# Patient Record
Sex: Male | Born: 1960 | Race: White | Hispanic: No | Marital: Single | Smoking: Never smoker
Health system: Southern US, Community
[De-identification: ages and names within clinical notes are randomized; demographics above are authoritative.]

## PROBLEM LIST (undated history)

## (undated) DIAGNOSIS — Z21 Asymptomatic human immunodeficiency virus [HIV] infection status: Secondary | ICD-10-CM

## (undated) DIAGNOSIS — E785 Hyperlipidemia, unspecified: Secondary | ICD-10-CM

## (undated) DIAGNOSIS — R55 Syncope and collapse: Secondary | ICD-10-CM

## (undated) DIAGNOSIS — I10 Essential (primary) hypertension: Secondary | ICD-10-CM

## (undated) DIAGNOSIS — B20 Human immunodeficiency virus [HIV] disease: Secondary | ICD-10-CM

## (undated) DIAGNOSIS — E7800 Pure hypercholesterolemia, unspecified: Secondary | ICD-10-CM

## (undated) DIAGNOSIS — G4733 Obstructive sleep apnea (adult) (pediatric): Secondary | ICD-10-CM

## (undated) DIAGNOSIS — R42 Dizziness and giddiness: Secondary | ICD-10-CM

## (undated) DIAGNOSIS — R002 Palpitations: Secondary | ICD-10-CM

## (undated) DIAGNOSIS — F419 Anxiety disorder, unspecified: Secondary | ICD-10-CM

## (undated) HISTORY — DX: Essential (primary) hypertension: I10

## (undated) HISTORY — DX: Syncope and collapse: R55

## (undated) HISTORY — DX: Dizziness and giddiness: R42

## (undated) HISTORY — DX: Human immunodeficiency virus (HIV) disease: B20

## (undated) HISTORY — DX: Palpitations: R00.2

## (undated) HISTORY — DX: Pure hypercholesterolemia, unspecified: E78.00

## (undated) HISTORY — DX: Anxiety disorder, unspecified: F41.9

## (undated) HISTORY — DX: Hyperlipidemia, unspecified: E78.5

## (undated) HISTORY — DX: Obstructive sleep apnea (adult) (pediatric): G47.33

## (undated) HISTORY — DX: Asymptomatic human immunodeficiency virus (hiv) infection status: Z21

---

## 1999-02-04 ENCOUNTER — Emergency Department (HOSPITAL_COMMUNITY): Admission: EM | Admit: 1999-02-04 | Discharge: 1999-02-04 | Payer: Self-pay | Admitting: Emergency Medicine

## 1999-02-07 ENCOUNTER — Encounter (HOSPITAL_COMMUNITY): Admission: RE | Admit: 1999-02-07 | Discharge: 1999-02-11 | Payer: Self-pay | Admitting: Emergency Medicine

## 1999-02-18 ENCOUNTER — Encounter: Admission: RE | Admit: 1999-02-18 | Discharge: 1999-05-19 | Payer: Self-pay | Admitting: Emergency Medicine

## 2000-11-27 ENCOUNTER — Emergency Department (HOSPITAL_COMMUNITY): Admission: EM | Admit: 2000-11-27 | Discharge: 2000-11-27 | Payer: Self-pay | Admitting: *Deleted

## 2000-11-27 ENCOUNTER — Encounter: Payer: Self-pay | Admitting: Emergency Medicine

## 2002-06-21 HISTORY — PX: TONSILLECTOMY: SUR1361

## 2002-06-21 HISTORY — PX: UVULOPALATOPHARYNGOPLASTY: SHX827

## 2007-10-24 ENCOUNTER — Emergency Department (HOSPITAL_COMMUNITY): Admission: EM | Admit: 2007-10-24 | Discharge: 2007-10-24 | Payer: Self-pay | Admitting: Emergency Medicine

## 2008-06-27 ENCOUNTER — Emergency Department (HOSPITAL_COMMUNITY): Admission: EM | Admit: 2008-06-27 | Discharge: 2008-06-27 | Payer: Self-pay | Admitting: Emergency Medicine

## 2008-07-18 ENCOUNTER — Encounter: Payer: Self-pay | Admitting: *Deleted

## 2009-05-06 IMAGING — CR DG CHEST 2V
2 series · 2 of 2 positions shown · non-contrast
Comparison: None

CLINICAL DATA: Chest pain

CHEST - 2 VIEW

[w chest pa]
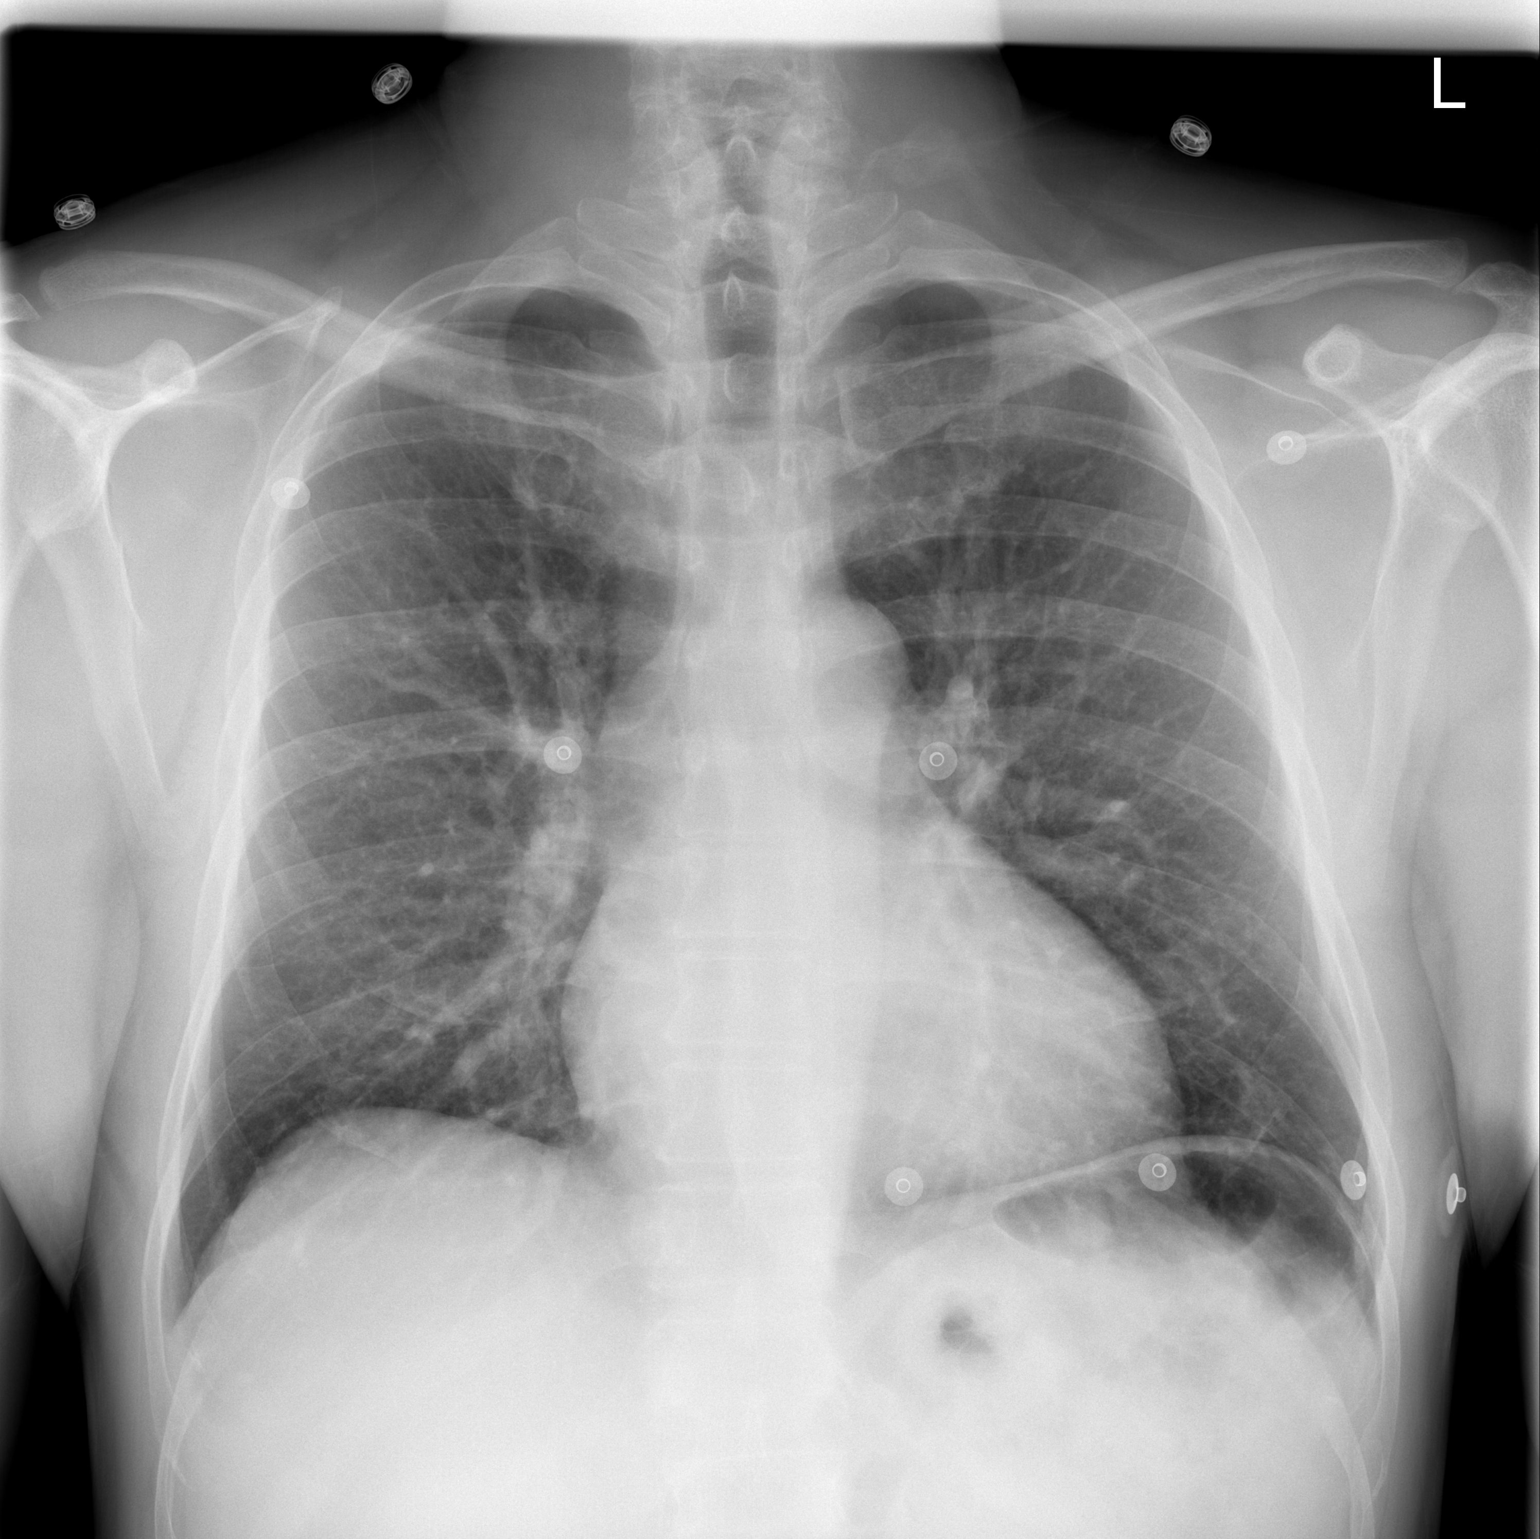

[w chest lat]
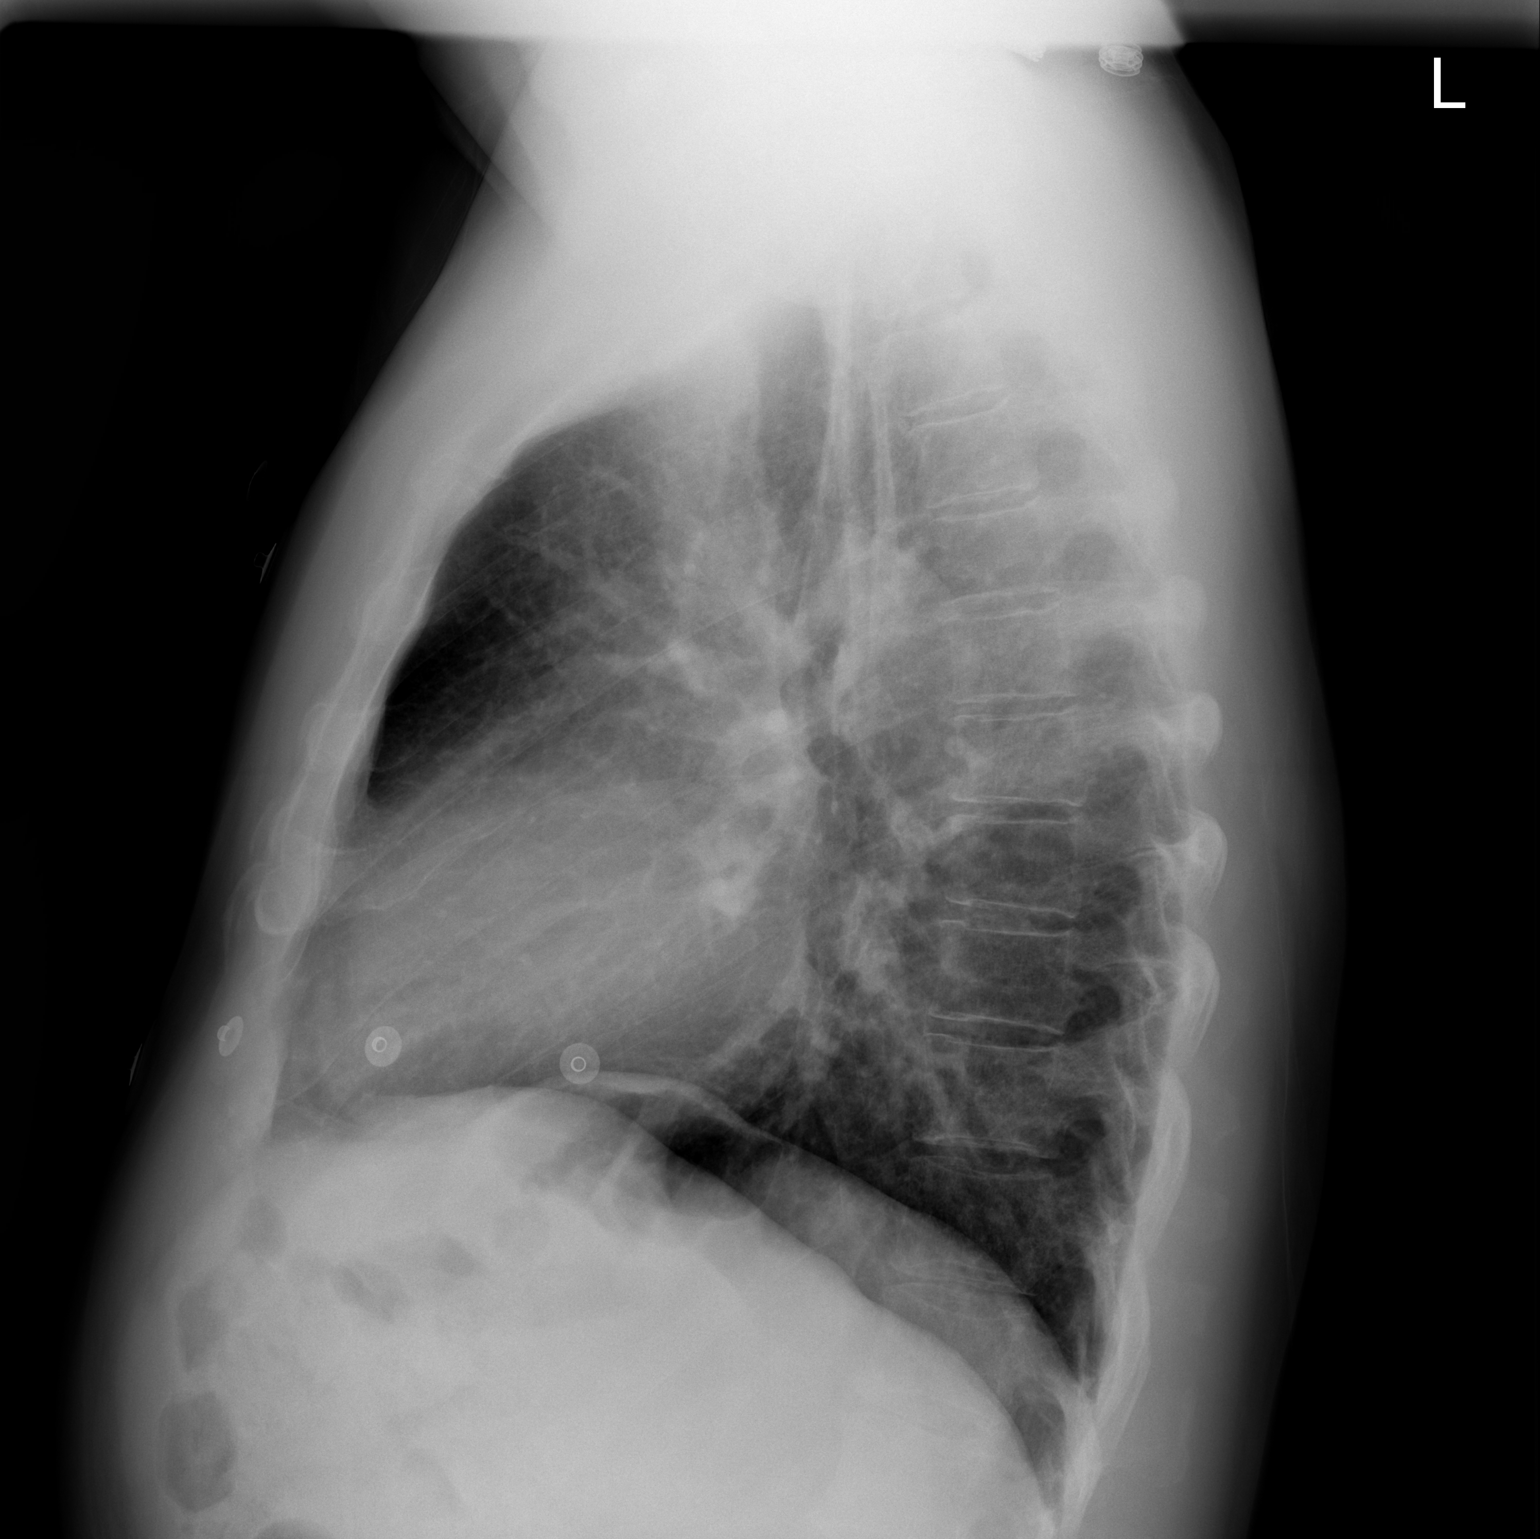

[2 of 2 positions shown; findings below may reference images not displayed]

FINDINGS: Normal mediastinum and cardiac silhouette.  Costophrenic
angles are clear.  No evidence effusion, infiltrate, or
pneumothorax.
IMPRESSION: No acute cardiopulmonary process.

## 2009-12-04 ENCOUNTER — Emergency Department (HOSPITAL_COMMUNITY): Admission: EM | Admit: 2009-12-04 | Discharge: 2009-12-04 | Payer: Self-pay | Admitting: Emergency Medicine

## 2010-10-05 LAB — URINALYSIS, ROUTINE W REFLEX MICROSCOPIC
Bilirubin Urine: NEGATIVE
Glucose, UA: NEGATIVE mg/dL
Hgb urine dipstick: NEGATIVE
Ketones, ur: NEGATIVE mg/dL
Nitrite: NEGATIVE
Protein, ur: NEGATIVE mg/dL
Specific Gravity, Urine: 1.016 (ref 1.005–1.030)
Urobilinogen, UA: 0.2 mg/dL (ref 0.0–1.0)
pH: 7 (ref 5.0–8.0)

## 2010-10-05 LAB — POCT I-STAT, CHEM 8
BUN: 17 mg/dL (ref 6–23)
Calcium, Ion: 1.04 mmol/L — ABNORMAL LOW (ref 1.12–1.32)
Chloride: 102 mEq/L (ref 96–112)
Creatinine, Ser: 1.1 mg/dL (ref 0.4–1.5)
Glucose, Bld: 106 mg/dL — ABNORMAL HIGH (ref 70–99)
HCT: 46 % (ref 39.0–52.0)
Hemoglobin: 15.6 g/dL (ref 13.0–17.0)
Potassium: 3.5 mEq/L (ref 3.5–5.1)
Sodium: 140 mEq/L (ref 135–145)
TCO2: 28 mmol/L (ref 0–100)

## 2011-04-22 DIAGNOSIS — B20 Human immunodeficiency virus [HIV] disease: Secondary | ICD-10-CM | POA: Insufficient documentation
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
## 2011-11-20 ENCOUNTER — Encounter: Payer: Self-pay | Admitting: *Deleted

## 2012-02-24 DIAGNOSIS — G4733 Obstructive sleep apnea (adult) (pediatric): Secondary | ICD-10-CM | POA: Insufficient documentation

## 2012-02-24 DIAGNOSIS — E7800 Pure hypercholesterolemia, unspecified: Secondary | ICD-10-CM | POA: Insufficient documentation

## 2012-02-24 DIAGNOSIS — I10 Essential (primary) hypertension: Secondary | ICD-10-CM | POA: Insufficient documentation

## 2014-07-01 ENCOUNTER — Encounter (HOSPITAL_COMMUNITY): Payer: Self-pay | Admitting: Emergency Medicine

## 2014-07-01 ENCOUNTER — Emergency Department (HOSPITAL_COMMUNITY)
Admission: EM | Admit: 2014-07-01 | Discharge: 2014-07-01 | Disposition: A | Discharge status: Home / Self Care | Payer: Managed Care, Other (non HMO) | Attending: Emergency Medicine | Admitting: Emergency Medicine

## 2014-07-01 DIAGNOSIS — Z880 Allergy status to penicillin: Secondary | ICD-10-CM | POA: Insufficient documentation

## 2014-07-01 DIAGNOSIS — Y998 Other external cause status: Secondary | ICD-10-CM | POA: Diagnosis not present

## 2014-07-01 DIAGNOSIS — Z79899 Other long term (current) drug therapy: Secondary | ICD-10-CM | POA: Insufficient documentation

## 2014-07-01 DIAGNOSIS — Y9389 Activity, other specified: Secondary | ICD-10-CM | POA: Insufficient documentation

## 2014-07-01 DIAGNOSIS — F419 Anxiety disorder, unspecified: Secondary | ICD-10-CM | POA: Insufficient documentation

## 2014-07-01 DIAGNOSIS — T148XXA Other injury of unspecified body region, initial encounter: Secondary | ICD-10-CM

## 2014-07-01 DIAGNOSIS — S51802A Unspecified open wound of left forearm, initial encounter: Secondary | ICD-10-CM | POA: Diagnosis present

## 2014-07-01 DIAGNOSIS — Z8669 Personal history of other diseases of the nervous system and sense organs: Secondary | ICD-10-CM | POA: Diagnosis not present

## 2014-07-01 DIAGNOSIS — E780 Pure hypercholesterolemia: Secondary | ICD-10-CM | POA: Diagnosis not present

## 2014-07-01 DIAGNOSIS — Z7982 Long term (current) use of aspirin: Secondary | ICD-10-CM | POA: Insufficient documentation

## 2014-07-01 DIAGNOSIS — Z21 Asymptomatic human immunodeficiency virus [HIV] infection status: Secondary | ICD-10-CM | POA: Diagnosis not present

## 2014-07-01 DIAGNOSIS — E785 Hyperlipidemia, unspecified: Secondary | ICD-10-CM | POA: Diagnosis not present

## 2014-07-01 DIAGNOSIS — Y9289 Other specified places as the place of occurrence of the external cause: Secondary | ICD-10-CM | POA: Insufficient documentation

## 2014-07-01 DIAGNOSIS — I10 Essential (primary) hypertension: Secondary | ICD-10-CM | POA: Diagnosis not present

## 2014-07-01 DIAGNOSIS — S51832A Puncture wound without foreign body of left forearm, initial encounter: Secondary | ICD-10-CM | POA: Diagnosis not present

## 2014-07-01 DIAGNOSIS — X58XXXA Exposure to other specified factors, initial encounter: Secondary | ICD-10-CM | POA: Insufficient documentation

## 2014-07-01 NOTE — ED Provider Notes (Signed)
CSN: [id number]     Arrival date & time 07/01/14  2034 History  This chart was scribed for Antony Madura, PA-C, working with Suzi Roots, MD by Elon Spanner, ED Scribe. This patient was seen in room WTR8/WTR8 and the patient's care was started at 9:10 PM.   Chief Complaint  Patient presents with  . Animal Bite   The history is provided by the patient. No language interpreter was used.   HPI Comments: Gary Acevedo is a 54 y.o. male who presents to the Emergency Department complaining of a wound on his left forearm.  Patient reports he was at his Dr.'s office in [place] today at 4:00 pm today when he looked at his arm and noticed it was actively bleeding. The patient did not notice the bites this morning when he awoke and has not napped today.  The patient searched, but not bat was found in the house.  Patient is UTD on tetanus.  Patient denies fever, redness/swelling, pain at wound site. He states he is concerned about bat exposure because he found a bat in his home 15 years ago which required rabies PEP.   Past Medical History  Diagnosis Date  . Near syncope   . OSA (obstructive sleep apnea)   . Hypertension   . HIV (human immunodeficiency virus infection)   . Hypercholesteremia   . Dizziness   . Dyslipidemia   . Palpitations   . Anxiety    Past Surgical History  Procedure Laterality Date  . Tonsillectomy     Family History  Problem Relation Age of Onset  . COPD Mother   . Hypertension Mother   . Hypertension Father   . Hyperlipidemia Father    History  Substance Use Topics  . Smoking status: Never Smoker   . Smokeless tobacco: Not on file  . Alcohol Use: Yes    Review of Systems  Skin: Positive for wound.  All other systems reviewed and are negative.   Allergies  Amoxicillin and Sulfa drugs cross reactors  Home Medications   Prior to Admission medications   Medication Sig Start Date End Date Taking? Authorizing Provider  abacavir (ZIAGEN) 300 MG tablet Take  300 mg by mouth 2 (two) times daily.    Historical Provider, MD  ALPRAZolam Prudy Feeler) 0.5 MG tablet Take 0.5 mg by mouth at bedtime as needed.    Historical Provider, MD  amLODipine (NORVASC) 5 MG tablet Take 5 mg by mouth daily.    Historical Provider, MD  aspirin 81 MG tablet Take 81 mg by mouth daily.    Historical Provider, MD  atorvastatin (LIPITOR) 20 MG tablet Take 20 mg by mouth daily.    Historical Provider, MD  lamiVUDine (EPIVIR) 150 MG tablet Take 150 mg by mouth 2 (two) times daily.    Historical Provider, MD  nevirapine (VIRAMUNE) 200 MG tablet Take 200 mg by mouth 2 (two) times daily.    Historical Provider, MD  sertraline (ZOLOFT) 50 MG tablet Take 50 mg by mouth daily.    Historical Provider, MD   BP 165/105 mmHg  Pulse 56  Temp(Src) 98.4 F (36.9 C) (Oral)  Resp 16  SpO2 99%   Physical Exam  Constitutional: He is oriented to person, place, and time. He appears well-developed and well-nourished. No distress.  Nontoxic/nonseptic appearing  HENT:  Head: Normocephalic and atraumatic.  Eyes: Conjunctivae and EOM are normal. No scleral icterus.  Neck: Normal range of motion.  Cardiovascular: Normal rate, regular rhythm and intact  distal pulses.   Distal radial pulse 2+ in LUE  Pulmonary/Chest: Effort normal. No respiratory distress.  Respirations even and unlabored  Musculoskeletal: Normal range of motion. He exhibits no tenderness.       Arms: Neurological: He is alert and oriented to person, place, and time. He exhibits normal muscle tone. Coordination normal.  Normal sensation in b/l upper extremities. Patient moves extremities without ataxia.  Skin: Skin is warm and dry. No rash noted. He is not diaphoretic. No erythema. No pallor.  2 punctate wounds to proximal of L forearm. 1 wound with punctate scab over top. No surrounding erythema, induration or heat to touch. No associated TTP at wound site.  Psychiatric: He has a normal mood and affect. His behavior is normal.   Nursing note and vitals reviewed.   ED Course  Procedures (including critical care time)  DIAGNOSTIC STUDIES: Oxygen Saturation is 99% on RA, normal by my interpretation.    COORDINATION OF CARE:  9:15 PM Discussed treatment plan with patient at bedside.  Patient acknowledges and agrees with plan.    Labs Review Labs Reviewed - No data to display  Imaging Review No results found.   EKG Interpretation None      MDM   Final diagnoses:  Puncture wound    Patient presents for punctate wounds to his L forearm which he noticed after leaving his PCP office at 1600 today. His attention was drawn to the area after noticing blood on his shirt. He is concerned about being bitten by a bat, but has not found a bat in his house or any evidence of a bat (such as bat droppings) in his residence. He does state he required rabies PEP 15 years ago after noticing a bat in his bedroom. He has no known exposure to any animal or insect causing his wounds. He does not know exactly when his wound occurred. He is neurovascularly intact on exam today.  Given history and physical exam, I do not believe the patient requires rabies PEP treatment at this time. Patient worried about bat exposure given history of a bat in his home 15 years ago. I do not believe it is reasonable to initiate PEP treatment for rabies given this fact alone. I have expressed to the patient that he may follow up with his PCP as he sees fit, as well as contact animal control for answering of any additional questions. Return precautions discussed and patient agreeable to plan with no unaddressed concerns.  I personally performed the services described in this documentation, which was scribed in my presence. The recorded information has been reviewed and is accurate.   Filed Vitals:   07/01/14 2050  BP: 165/105  Pulse: 56  Temp: 98.4 F (36.9 C)  TempSrc: Oral  Resp: 16  SpO2: 99%      Antony MaduraKelly Mychal Decarlo, PA-C 07/01/14  2219  Suzi RootsKevin E Steinl, MD 07/02/14 1041

## 2014-07-01 NOTE — Discharge Instructions (Signed)
Puncture Wound °A puncture wound is an injury that extends through all layers of the skin and into the tissue beneath the skin (subcutaneous tissue). Puncture wounds become infected easily because germs often enter the body and go beneath the skin during the injury. Having a deep wound with a small entrance point makes it difficult for your caregiver to adequately clean the wound. This is especially true if you have stepped on a nail and it has passed through a dirty shoe or other situations where the wound is obviously contaminated. °CAUSES  °Many puncture wounds involve glass, nails, splinters, fish hooks, or other objects that enter the skin (foreign bodies). A puncture wound may also be caused by a human bite or animal bite. °DIAGNOSIS  °A puncture wound is usually diagnosed by your history and a physical exam. You may need to have an X-ray or an ultrasound to check for any foreign bodies still in the wound. °TREATMENT  °· Your caregiver will clean the wound as thoroughly as possible. Depending on the location of the wound, a bandage (dressing) may be applied. °· Your caregiver might prescribe antibiotic medicines. °· You may need a follow-up visit to check on your wound. Follow all instructions as directed by your caregiver. °HOME CARE INSTRUCTIONS  °· Change your dressing once per day, or as directed by your caregiver. If the dressing sticks, it may be removed by soaking the area in water. °· If your caregiver has given you follow-up instructions, it is very important that you return for a follow-up appointment. Not following up as directed could result in a chronic or permanent injury, pain, and disability. °· Only take over-the-counter or prescription medicines for pain, discomfort, or fever as directed by your caregiver. °· If you are given antibiotics, take them as directed. Finish them even if you start to feel better. °You may need a tetanus shot if: °· You cannot remember when you had your last tetanus  shot. °· You have never had a tetanus shot. °If you got a tetanus shot, your arm may swell, get red, and feel warm to the touch. This is common and not a problem. If you need a tetanus shot and you choose not to have one, there is a rare chance of getting tetanus. Sickness from tetanus can be serious. °You may need a rabies shot if an animal bite caused your puncture wound. °SEEK MEDICAL CARE IF:  °· You have redness, swelling, or increasing pain in the wound. °· You have red streaks going away from the wound. °· You notice a bad smell coming from the wound or dressing. °· You have yellowish-white fluid (pus) coming from the wound. °· You are treated with an antibiotic for infection, but the infection is not getting better. °· You notice something in the wound, such as rubber from your shoe, cloth, or another object. °· You have a fever. °· You have severe pain. °· You have difficulty breathing. °· You feel dizzy or faint. °· You cannot stop vomiting. °· You lose feeling, develop numbness, or cannot move a limb below the wound. °· Your symptoms worsen. °MAKE SURE YOU: °· Understand these instructions. °· Will watch your condition. °· Will get help right away if you are not doing well or get worse. °Document Released: 03/17/2005 Document Revised: 08/30/2011 Document Reviewed: 11/24/2010 °[employer]® Patient Information ©2015 [employer]. This information is not intended to replace advice given to you by your health care provider. Make sure you discuss any questions you   have with your health care provider. ° °

## 2014-07-01 NOTE — ED Notes (Signed)
Pt sts that earlier today he looked down at his L arm and noticed it was bleeding. Pt concerned that he has two small puncture wounds. Pt wanted to get checked out because he is worried it could've been a bat. Pt did not find a bat in his house. Bleeding controled. A&Ox4. Denies pain.

## 2014-07-05 ENCOUNTER — Emergency Department (HOSPITAL_COMMUNITY)
Admission: EM | Admit: 2014-07-05 | Discharge: 2014-07-05 | Disposition: A | Discharge status: Home / Self Care | Payer: Managed Care, Other (non HMO) | Attending: Emergency Medicine | Admitting: Emergency Medicine

## 2014-07-05 ENCOUNTER — Encounter (HOSPITAL_COMMUNITY): Payer: Self-pay

## 2014-07-05 DIAGNOSIS — I10 Essential (primary) hypertension: Secondary | ICD-10-CM | POA: Diagnosis not present

## 2014-07-05 DIAGNOSIS — Z8669 Personal history of other diseases of the nervous system and sense organs: Secondary | ICD-10-CM | POA: Insufficient documentation

## 2014-07-05 DIAGNOSIS — Z23 Encounter for immunization: Secondary | ICD-10-CM | POA: Insufficient documentation

## 2014-07-05 DIAGNOSIS — Z7982 Long term (current) use of aspirin: Secondary | ICD-10-CM | POA: Diagnosis not present

## 2014-07-05 DIAGNOSIS — Z21 Asymptomatic human immunodeficiency virus [HIV] infection status: Secondary | ICD-10-CM | POA: Insufficient documentation

## 2014-07-05 DIAGNOSIS — W5581XS Bitten by other mammals, sequela: Secondary | ICD-10-CM | POA: Diagnosis not present

## 2014-07-05 DIAGNOSIS — E785 Hyperlipidemia, unspecified: Secondary | ICD-10-CM | POA: Insufficient documentation

## 2014-07-05 DIAGNOSIS — T148XXA Other injury of unspecified body region, initial encounter: Secondary | ICD-10-CM

## 2014-07-05 DIAGNOSIS — F419 Anxiety disorder, unspecified: Secondary | ICD-10-CM | POA: Diagnosis not present

## 2014-07-05 DIAGNOSIS — Z043 Encounter for examination and observation following other accident: Secondary | ICD-10-CM | POA: Insufficient documentation

## 2014-07-05 DIAGNOSIS — E780 Pure hypercholesterolemia: Secondary | ICD-10-CM | POA: Insufficient documentation

## 2014-07-05 DIAGNOSIS — Z79899 Other long term (current) drug therapy: Secondary | ICD-10-CM | POA: Diagnosis not present

## 2014-07-05 MED ORDER — RABIES IMMUNE GLOBULIN 150 UNIT/ML IM INJ
20.0000 [IU]/kg | INJECTION | Freq: Once | INTRAMUSCULAR | Status: AC
  Administered 2014-07-05: 1650 [IU]
  Filled 2014-07-05: qty 11

## 2014-07-05 MED ORDER — RABIES VACCINE, PCEC IM SUSR
1.0000 mL | Freq: Once | INTRAMUSCULAR | Status: AC
  Administered 2014-07-05: 1 mL via INTRAMUSCULAR
  Filled 2014-07-05: qty 1

## 2014-07-05 NOTE — ED Notes (Signed)
Pt was at MD office and noticed blood on arm. Went home and noticed 2 bites together.  ? Insect vs bat bite.  Pt has hx of having bats in home.  Primary recommended follow up once inspector confirmed.  Bite first seen on Monday.

## 2014-07-05 NOTE — Discharge Instructions (Signed)
Animal Bite °An animal bite can result in a scratch on the skin, deep open cut, puncture of the skin, crush injury, or tearing away of the skin or a body part. Dogs are responsible for most animal bites. Children are bitten more often than adults. An animal bite can range from very mild to more serious. A small bite from your house pet is no cause for alarm. However, some animal bites can become infected or injure a bone or other tissue. You must seek medical care if: °· The skin is broken and bleeding does not slow down or stop after 15 minutes. °· The puncture is deep and difficult to clean (such as a cat bite). °· Pain, warmth, redness, or pus develops around the wound. °· The bite is from a stray animal or rodent. There may be a risk of rabies infection. °· The bite is from a snake, raccoon, skunk, fox, coyote, or bat. There may be a risk of rabies infection. °· The person bitten has a chronic illness such as diabetes, liver disease, or cancer, or the person takes medicine that lowers the immune system. °· There is concern about the location and severity of the bite. °It is important to clean and protect an animal bite wound right away to prevent infection. Follow these steps: °· Clean the wound with plenty of water and soap. °· Apply an antibiotic cream. °· Apply gentle pressure over the wound with a clean towel or gauze to slow or stop bleeding. °· Elevate the affected area above the heart to help stop any bleeding. °· Seek medical care. Getting medical care within 8 hours of the animal bite leads to the best possible outcome. °DIAGNOSIS  °Your caregiver will most likely: °· Take a detailed history of the animal and the bite injury. °· Perform a wound exam. °· Take your medical history. °Blood tests or X-rays may be performed. Sometimes, infected bite wounds are cultured and sent to a lab to identify the infectious bacteria.  °TREATMENT  °Medical treatment will depend on the location and type of animal bite as  well as the patient's medical history. Treatment may include: °· Wound care, such as cleaning and flushing the wound with saline solution, bandaging, and elevating the affected area. °· Antibiotics. °· Tetanus immunization. °· Rabies immunization. °· Leaving the wound open to heal. This is often done with animal bites, due to the high risk of infection. However, in certain cases, wound closure with stitches, wound adhesive, skin adhesive strips, or staples may be used. ° Infected bites that are left untreated may require intravenous (IV) antibiotics and surgical treatment in the hospital. °HOME CARE INSTRUCTIONS °· Follow your caregiver's instructions for wound care. °· Take all medicines as directed. °· If your caregiver prescribes antibiotics, take them as directed. Finish them even if you start to feel better. °· Follow up with your caregiver for further exams or immunizations as directed. °You may need a tetanus shot if: °· You cannot remember when you had your last tetanus shot. °· You have never had a tetanus shot. °· The injury broke your skin. °If you get a tetanus shot, your arm may swell, get red, and feel warm to the touch. This is common and not a problem. If you need a tetanus shot and you choose not to have one, there is a rare chance of getting tetanus. Sickness from tetanus can be serious. °SEEK MEDICAL CARE IF: °· You notice warmth, redness, soreness, swelling, pus discharge, or a bad   smell coming from the wound. °· You have a red line on the skin coming from the wound. °· You have a fever, chills, or a general ill feeling. °· You have nausea or vomiting. °· You have continued or worsening pain. °· You have trouble moving the injured part. °· You have other questions or concerns. °MAKE SURE YOU: °· Understand these instructions. °· Will watch your condition. °· Will get help right away if you are not doing well or get worse. °Document Released: 02/23/2011 Document Revised: 08/30/2011 Document  Reviewed: 02/23/2011 °Exit[employer]® Patient Information ©2015 [employer]. This information is not intended to replace advice given to you by your health care provider. Make sure you discuss any questions you have with your health care provider. ° °Rabies  °Rabies is a viral infection that can be spread to people from infected animals. The infection affects the brain and central nervous system. Once the disease develops, it almost always causes death. Because of this, when a person is bitten by an animal that may have rabies, treatment to prevent rabies often needs to be started whether or not the animal is known to be infected. Prompt treatment with the rabies vaccine and rabies immune globulin is very effective at preventing the infection from developing in people who have been exposed to the rabies virus. °CAUSES  °Rabies is caused by a virus that lives inside some animals. When a person is bitten by an infected animal, the rabies virus is spread to the person through the infected spit (saliva) of the animal. This virus can be carried by animals such as dogs, cats, skunks, bats, woodchucks, raccoons, coyotes, and foxes. °SYMPTOMS  °By the time symptoms appear, rabies is usually fatal for the person. Common symptoms include: °· Headache. °· Fever. °· Fatigue and weakness. °· Agitation. °· Anxiety. °· Confusion. °· Unusual behavior, such as hyperactivity, fear of water (hydrophobia), or fear of air (aerophobia). °· Hallucinations. °· Insomnia. °· Weakness in the arms or legs. °· Difficulty swallowing. °Most people get sick in 1-3 months after being bitten. This often varies and may depend on the location of the bite. The infection will take less time to develop if the bite occurred closer to the head.  °DIAGNOSIS  °To determine if a person is infected, several tests must be performed, such as: °· A skin biopsy. °· A saliva test. °· A lumbar puncture to remove spinal fluid so it can be examined. °· Blood  tests. °TREATMENT  °Treatment to prevent the infection from developing (post-exposure prophylaxis, PEP) is often started before knowing for sure if the person has been exposed to the rabies virus. PEP involves cleaning the wound, giving an antibody injection (rabies immune globulin), and giving a series of rabies vaccine injections. The series of injections are usually given over a two-week period. If possible, the animal that bit the person will be observed to see if it remains healthy. If the animal has been killed, it can be sent to a state laboratory and examined to see if the animal had rabies. °If a person is bitten by a domestic animal (dog, cat, or ferret) that appears healthy and can be observed to see if it remains healthy, often no further treatment is necessary other than care of the wounds caused by the animal. °Rabies is often a fatal illness once the infection develops in a person. Although a few people who developed rabies have survived after experimental treatment with certain drugs, all these survivors still had severe nervous   system problems after the treatment. This is why caregivers use extra caution and begin PEP treatment for people who have been bitten by animals that are possibly infected with rabies.  °HOME CARE INSTRUCTIONS  °If you were bitten by an unknown animal, make sure you know your caregiver's instructions for follow-up. If the animal was sent to a laboratory for examination, ask when the test results will be ready. Make sure you get the test results.  °Take these steps to care for your wound: °· Keep the wound clean, dry, and dressed as directed by your caregiver. °· Keep the injured part elevated as much as possible. °· Do not resume use of the affected area until directed. °· Only take over-the-counter or prescription medicines as directed by your caregiver. °· Keep all follow-up appointments as directed by your caregiver. °PREVENTION  °To prevent rabies, people need to reduce  their risk of having contact with infected animals.  °· Make sure your pets (dogs, cats, ferrets) are vaccinated against rabies. Keep these vaccinations up-to-date as directed by your veterinarian. °· Supervise your pets when they are outside. Keep them away from wild animals. °· Call your local animal control services to report any stray animals. These animals may not be vaccinated. °· Stay away from stray or wild animals. °· Consider getting the rabies vaccine (preexposure) if you are traveling to an area where rabies is common or if your job or activities involve possible contact with wild or stray animals. Discuss this with your caregiver. °Document Released: 06/07/2005 Document Revised: 03/01/2012 Document Reviewed: 01/04/2012 °[employer]® Patient Information ©2015 [employer]. This information is not intended to replace advice given to you by your health care provider. Make sure you discuss any questions you have with your health care provider. ° °

## 2014-07-05 NOTE — ED Notes (Signed)
PA at bedside.

## 2014-07-05 NOTE — ED Provider Notes (Signed)
CSN: [id number]     Arrival date & time 07/05/14  1615 History   None    Chief Complaint  Patient presents with  . Animal Bite   The history is provided by the patient. No language interpreter was used.   This chart was scribed for non-physician practitioner Everlene Farrier, PA-C,  working with No att. providers found, by Andrew Au, ED Scribe. This patient was seen in room WTR5/WTR5 and the patient's care was started at 11:02 PM.  Gary Acevedo is a 54 y.o. male with hx of HIV who presents to the Emergency Department complaining of an insect bite to left foreaerm noticed 4 days ago. Pt states he was seen here 4 days ago for what appeared to be bite marks to left forearm. Pt states he was seen by PCP 3 days ago who advised pt to call an exterminator. Pt states exterminator found bats in attic today.  Patient is requesting rabies vaccine for possible bat bite.  Pt denies fever, chills, abdominal pain, CP, SOB, cough and wheezing.     Past Medical History  Diagnosis Date  . Near syncope   . OSA (obstructive sleep apnea)   . Hypertension   . HIV (human immunodeficiency virus infection)   . Hypercholesteremia   . Dizziness   . Dyslipidemia   . Palpitations   . Anxiety    Past Surgical History  Procedure Laterality Date  . Tonsillectomy     Family History  Problem Relation Age of Onset  . COPD Mother   . Hypertension Mother   . Hypertension Father   . Hyperlipidemia Father    History  Substance Use Topics  . Smoking status: Never Smoker   . Smokeless tobacco: Not on file  . Alcohol Use: Yes    Review of Systems  Constitutional: Negative for fever and chills.  Respiratory: Negative for cough, shortness of breath and wheezing.   Gastrointestinal: Negative for nausea, vomiting and abdominal pain.  Musculoskeletal: Negative for myalgias.  Skin: Negative for color change, pallor, rash and wound.  Neurological: Negative for dizziness, weakness, numbness and headaches.    Allergies  Amoxicillin and Sulfa drugs cross reactors  Home Medications   Prior to Admission medications   Medication Sig Start Date End Date Taking? Authorizing Provider  abacavir (ZIAGEN) 300 MG tablet Take 300 mg by mouth 2 (two) times daily.    Historical Provider, MD  ALPRAZolam Prudy Feeler) 0.5 MG tablet Take 0.5 mg by mouth at bedtime as needed.    Historical Provider, MD  amLODipine (NORVASC) 5 MG tablet Take 5 mg by mouth daily.    Historical Provider, MD  aspirin 81 MG tablet Take 81 mg by mouth daily.    Historical Provider, MD  atorvastatin (LIPITOR) 20 MG tablet Take 20 mg by mouth daily.    Historical Provider, MD  lamiVUDine (EPIVIR) 150 MG tablet Take 150 mg by mouth 2 (two) times daily.    Historical Provider, MD  nevirapine (VIRAMUNE) 200 MG tablet Take 200 mg by mouth 2 (two) times daily.    Historical Provider, MD  sertraline (ZOLOFT) 50 MG tablet Take 50 mg by mouth daily.    Historical Provider, MD   BP 137/78 mmHg  Pulse 57  Temp(Src) 98.2 F (36.8 C) (Oral)  Resp 18  Wt 178 lb (80.74 kg)  SpO2 99% Physical Exam  Constitutional: He is oriented to person, place, and time. He appears well-developed and well-nourished. No distress.  HENT:  Head: Normocephalic and  atraumatic.  Right Ear: External ear normal.  Left Ear: External ear normal.  Mouth/Throat: Oropharynx is clear and moist. No oropharyngeal exudate.  Eyes: Conjunctivae and EOM are normal. Pupils are equal, round, and reactive to light.  Neck: Normal range of motion. Neck supple.  Cardiovascular: Normal rate, regular rhythm and normal heart sounds.  Exam reveals no gallop and no friction rub.   No murmur heard. Pulmonary/Chest: Effort normal and breath sounds normal. No respiratory distress. He has no wheezes. He has no rales. He exhibits no tenderness.  Abdominal: Soft. There is no tenderness.  Musculoskeletal: Normal range of motion.  Lymphadenopathy:    He has no cervical adenopathy.   Neurological: He is alert and oriented to person, place, and time. Coordination normal.  Skin: Skin is warm and dry. No rash noted. He is not diaphoretic. No erythema. No pallor.  There is no longer any evidence of his bite that he reports was seen several days ago.   Psychiatric: He has a normal mood and affect. His behavior is normal.  Nursing note and vitals reviewed.   ED Course  Procedures (including critical care time) DIAGNOSTIC STUDIES: Oxygen Saturation is 99% on RA, normal by my interpretation.    COORDINATION OF CARE: 5:45 PM- Pt advised of plan for treatment and pt agrees.  Labs Review Labs Reviewed - No data to display  Imaging Review No results found.   EKG Interpretation None      Filed Vitals:   07/05/14 1630 07/05/14 1756  BP: 137/78   Pulse: 57   Temp: 98.2 F (36.8 C)   TempSrc: Oral   Resp: 18   Weight:  178 lb (80.74 kg)  SpO2: 99%      MDM   Meds given in ED:  Medications  rabies immune globulin (HYPERAB) injection 1,650 Units (1,650 Units Infiltration Given 07/05/14 1935)  rabies vaccine (RABAVERT) injection 1 mL (1 mL Intramuscular Given 07/05/14 1931)    Discharge Medication List as of 07/05/2014  8:03 PM      Final diagnoses:  Animal bite   This is a 54 year old male who presents the emergency department after a suspected bat bite five days ago. Patient reports originally there is no evidence of abscess home. An exterminator found evidence of clots in his attic earlier today. Patient presents the ED requesting rabies vaccine. Patient has no signs of biting on his arm where he indicates was previously. Patient has no signs of illness. Patient is afebrile and nontoxic appearing. Patient was provided rabies immunoglobulin as well as rabies vaccine. Extensive information provided on follow[facility]. Patient verbalizes understanding of follow-up instructions. I advised the patient to follow-up with their  primary care provider this week. I advised the patient to return to the emergency department with new or worsening symptoms or new concerns. The patient verbalized understanding and agreement with plan.   This patient was discussed with Dr. Romeo AppleHarrison who agrees with assessment and plan.  I personally performed the services described in this documentation, which was scribed in my presence. The recorded information has been reviewed and is accurate.    Lawana ChambersWilliam Duncan Elianys Conry, PA-C 07/05/14 [id number]  Purvis SheffieldForrest Harrison, MD 07/06/14 1550

## 2014-07-08 ENCOUNTER — Encounter: Payer: Self-pay | Admitting: *Deleted

## 2014-07-08 ENCOUNTER — Emergency Department (INDEPENDENT_AMBULATORY_CARE_PROVIDER_SITE_OTHER)
Admission: EM | Admit: 2014-07-08 | Discharge: 2014-07-08 | Disposition: A | Discharge status: Home / Self Care | Payer: Managed Care, Other (non HMO) | Source: Home / Self Care

## 2014-07-08 DIAGNOSIS — Z203 Contact with and (suspected) exposure to rabies: Secondary | ICD-10-CM

## 2014-07-08 MED ORDER — RABIES VACCINE, PCEC IM SUSR
1.0000 mL | Freq: Once | INTRAMUSCULAR | Status: AC
  Administered 2014-07-08: 1 mL via INTRAMUSCULAR

## 2014-07-08 NOTE — ED Notes (Signed)
Pt is here today for a rabies vaccine. [facility]. Return vaccines are due 07/12/14 and 07/19/14.

## 2014-07-12 ENCOUNTER — Encounter (HOSPITAL_COMMUNITY): Payer: Self-pay | Admitting: Emergency Medicine

## 2014-07-12 ENCOUNTER — Emergency Department (HOSPITAL_COMMUNITY)
Admission: EM | Admit: 2014-07-12 | Discharge: 2014-07-12 | Disposition: A | Discharge status: Home / Self Care | Payer: Managed Care, Other (non HMO) | Attending: Emergency Medicine | Admitting: Emergency Medicine

## 2014-07-12 DIAGNOSIS — Z79899 Other long term (current) drug therapy: Secondary | ICD-10-CM | POA: Insufficient documentation

## 2014-07-12 DIAGNOSIS — Z8669 Personal history of other diseases of the nervous system and sense organs: Secondary | ICD-10-CM | POA: Insufficient documentation

## 2014-07-12 DIAGNOSIS — Z7982 Long term (current) use of aspirin: Secondary | ICD-10-CM | POA: Diagnosis not present

## 2014-07-12 DIAGNOSIS — F419 Anxiety disorder, unspecified: Secondary | ICD-10-CM | POA: Insufficient documentation

## 2014-07-12 DIAGNOSIS — I10 Essential (primary) hypertension: Secondary | ICD-10-CM | POA: Insufficient documentation

## 2014-07-12 DIAGNOSIS — Z21 Asymptomatic human immunodeficiency virus [HIV] infection status: Secondary | ICD-10-CM | POA: Diagnosis not present

## 2014-07-12 DIAGNOSIS — E785 Hyperlipidemia, unspecified: Secondary | ICD-10-CM | POA: Insufficient documentation

## 2014-07-12 DIAGNOSIS — Z87828 Personal history of other (healed) physical injury and trauma: Secondary | ICD-10-CM | POA: Insufficient documentation

## 2014-07-12 DIAGNOSIS — Z23 Encounter for immunization: Secondary | ICD-10-CM | POA: Diagnosis not present

## 2014-07-12 MED ORDER — RABIES VACCINE, PCEC IM SUSR
1.0000 mL | Freq: Once | INTRAMUSCULAR | Status: AC
  Administered 2014-07-12: 1 mL via INTRAMUSCULAR
  Filled 2014-07-12: qty 1

## 2014-07-12 NOTE — ED Notes (Signed)
Pt states that he is here to get 3rd round of rabies injections. Pt states that urgent care is closed.

## 2014-07-12 NOTE — ED Provider Notes (Signed)
CSN: [id number]     Arrival date & time 07/12/14  1437 History   First MD Initiated Contact with Patient 07/12/14 1441     Chief Complaint  Patient presents with  . Rabies Injection     (Consider location/radiation/quality/duration/timing/severity/associated sxs/prior Treatment) The history is provided by the patient. No language interpreter was used.  Gary Acevedo is a 54 y/o M with PMHx of OSA, HTN, HIV, hypercholesterolemia presenting to the ED with rabies vaccine, third dose. Patient reported that he was seen and assessed on 07/05/2014 regarding bat bite. Patient reported that he noticed marks on his left forearm that looked like a bat - stated that he had a history of bat infestation in his house before. Reported that an exterminator came and stated that there was fresh bat droppings in his attic. Patient received his first dose on 07/05/2014, second dose on 07/08/2014 and patient is here in the ED for his third dose. Denied fever, rash, swelling, neck pain, neck stiffness, headache, blurred vision, eating changes, chest pain, shortness of breath, difficulty breathing, abdominal pain, nausea, vomiting. PCP Dr. Renne CriglerPharr  Past Medical History  Diagnosis Date  . Near syncope   . OSA (obstructive sleep apnea)   . Hypertension   . HIV (human immunodeficiency virus infection)   . Hypercholesteremia   . Dizziness   . Dyslipidemia   . Palpitations   . Anxiety    Past Surgical History  Procedure Laterality Date  . Tonsillectomy     Family History  Problem Relation Age of Onset  . COPD Mother   . Hypertension Mother   . Hypertension Father   . Hyperlipidemia Father    History  Substance Use Topics  . Smoking status: Never Smoker   . Smokeless tobacco: Not on file  . Alcohol Use: Yes    Review of Systems  Constitutional: Negative for fever and chills.  Respiratory: Negative for chest tightness and shortness of breath.   Cardiovascular: Negative for chest pain.  Gastrointestinal:  Negative for nausea, vomiting and abdominal pain.  Skin: Negative for wound.  Neurological: Negative for dizziness, weakness and headaches.      Allergies  Amoxicillin and Sulfa drugs cross reactors  Home Medications   Prior to Admission medications   Medication Sig Start Date End Date Taking? Authorizing Provider  abacavir (ZIAGEN) 300 MG tablet Take 300 mg by mouth 2 (two) times daily.    Historical Provider, MD  ALPRAZolam Prudy Feeler(XANAX) 0.5 MG tablet Take 0.5 mg by mouth at bedtime as needed.    Historical Provider, MD  amLODipine (NORVASC) 5 MG tablet Take 5 mg by mouth daily.    Historical Provider, MD  aspirin 81 MG tablet Take 81 mg by mouth daily.    Historical Provider, MD  atorvastatin (LIPITOR) 20 MG tablet Take 20 mg by mouth daily.    Historical Provider, MD  lamiVUDine (EPIVIR) 150 MG tablet Take 150 mg by mouth 2 (two) times daily.    Historical Provider, MD  nevirapine (VIRAMUNE) 200 MG tablet Take 200 mg by mouth 2 (two) times daily.    Historical Provider, MD  sertraline (ZOLOFT) 50 MG tablet Take 50 mg by mouth daily.    Historical Provider, MD   BP 147/86 mmHg  Pulse 50  Temp(Src) 98 F (36.7 C) (Oral)  Resp 17  SpO2 99% Physical Exam  Constitutional: He is oriented to person, place, and time. He appears well-developed and well-nourished. No distress.  HENT:  Head: Normocephalic and atraumatic.  Mouth/Throat: Oropharynx is clear and moist. No oropharyngeal exudate.  Eyes: Conjunctivae and EOM are normal. Pupils are equal, round, and reactive to light. Right eye exhibits no discharge. Left eye exhibits no discharge.  Neck: Normal range of motion. Neck supple. No tracheal deviation present.  Cardiovascular: Normal rate, regular rhythm and normal heart sounds.  Exam reveals no friction rub.   No murmur heard. Pulmonary/Chest: Effort normal and breath sounds normal. No respiratory distress. He has no wheezes. He has no rales.  Musculoskeletal: Normal range of motion.   Lymphadenopathy:    He has no cervical adenopathy.  Neurological: He is alert and oriented to person, place, and time. No cranial nerve deficit. He exhibits normal muscle tone. Coordination normal.  Skin: Skin is warm and dry. No rash noted. He is not diaphoretic. No erythema.  Wound to the left forearm healing well - negative findings of cellulitic infection.   Psychiatric: He has a normal mood and affect. His behavior is normal. Thought content normal.  Nursing note and vitals reviewed.   ED Course  Procedures (including critical care time) Labs Review Labs Reviewed - No data to display  Imaging Review No results found.   EKG Interpretation None      MDM   Final diagnoses:  Need for rabies vaccination    Medications  rabies vaccine (RABAVERT) injection 1 mL (1 mL Intramuscular Given 07/12/14 1553)    Filed Vitals:   07/12/14 1441 07/12/14 1607 07/12/14 1611  BP: 146/86 147/86   Pulse: 60 50 50  Temp: 98 F (36.7 C)    TempSrc: Oral    Resp: 17 17   SpO2: 100% 100% 99%   Patient presenting to the ED for rabies vaccination, third dose. Patient received initial on 07/05/2014 and second dose at urgent care Center and 07/08/2014 regarding bat exposure and possible bat bite. Patient denied any complaints or reactions or complications with the vaccination. Patient received third dose of rabies vaccination. Patient stable, afebrile. Patient not septic appearing. Negative signs of respiratory distress. Negative signs of cellulitic infection. Discharged patient. Referred patient to PCP. Discussed with patient that fourth dose is due on 07/19/2014-patient understood. Discussed with patient to closely monitor symptoms and if symptoms are to worsen or change to report back to the ED - strict return instructions given.  Patient agreed to plan of care, understood, all questions answered.   Raymon Mutton, PA-C 07/12/14 1615  Audree Camel, MD 07/13/14 [phone]

## 2014-07-12 NOTE — Discharge Instructions (Signed)
Please call your doctor for a followup appointment within 24-48 hours. When you talk to your doctor please let them know that you were seen in the emergency department and have them acquire all of your records so that they can discuss the findings with you and formulate a treatment plan to fully care for your new and ongoing problems. Fourth dose of rabies vaccination is due on 07/19/2014 Please closely monitor skin where your bit for signs of infection Please continue to monitor symptoms closely and if symptoms are to worsen or change (fever greater than 101, chills, sweating, nausea, vomiting, chest pain, shortness of breathe, difficulty breathing, weakness, numbness, tingling, worsening or changes to pain pattern, swelling, neck pain, neck stiffness, swelling to the arm, pus drainage, bleeding, decreased range of motion) please report back to the Emergency Department immediately.    Rabies Vaccine What You Need to Know WHAT IS RABIES?  Rabies is a serious disease. It is caused by a virus.  Rabies is mainly a disease of animals. Humans get rabies when they are bitten by infected animals.  At first there might not be any symptoms. But weeks, or even years after a bite, rabies can cause pain, fatigue, headaches, fever, and irritability. These are followed by seizures, hallucinations, and paralysis. Human rabies is almost always fatal.  Wild animals, especially bats, are the most common source of human rabies infection in the [place]. Skunks, raccoons, dogs, cats, coyotes, foxes, and other mammals can also transmit the disease.  Human rabies is rare in the [place]. There have been only 55 cases diagnosed since 1990. However, between 16,000 and 39,000 people are vaccinated each year as a precaution after animal bites. Also, rabies is far more common in other parts of the world, with about 40,000 to 70,000 rabies-related deaths worldwide each year. Bites from unvaccinated dogs cause most  of these cases. Rabies vaccine can prevent rabies. RABIES VACCINE  Rabies vaccine is given to people at high risk of rabies to protect them if they are exposed. It can also prevent the disease if it is given to a person after they have been exposed.  Rabies vaccine is made from killed rabies virus. It cannot cause rabies. WHO SHOULD GET RABIES VACCINE AND WHEN? Preventive Vaccination (No Exposure)  People at high risk of exposure to rabies, such as veterinarians, Educational psychologistanimal handlers, rabies laboratory workers, spelunkers, and rabies biologics production workers should be offered rabies vaccine.  The vaccine should also be considered for:  People whose activities bring them into frequent contact with rabies virus or with possibly rabid animals.  International travelers who are likely to come in contact with animals in parts of the world where rabies is common.  The pre-exposure schedule for rabies vaccination is 3 doses, given at the following times:  Dose 1: As appropriate.  Dose 2: 7 days after Dose 1.  Dose 3: 21 days or 28 days after Dose 1.  For laboratory workers and others who may be repeatedly exposed to rabies virus, periodic testing for immunity is recommended and booster doses should be given as needed. (Testing or booster doses are not recommended for travelers). Ask your doctor for details. Vaccination After an Exposure Anyone who has been bitten by an animal, or who otherwise may have been exposed to rabies, should clean the wound and see a doctor immediately. The doctor will determine if they need to be vaccinated. A person who is exposed and has never been vaccinated against rabies should get  4 doses of rabies vaccine: one dose right away and additional doses on the 3rd, 7th, and 14th days. They should also get another shot called Rabies Immune Globulin at the same time as the first dose.  A person who has been previously vaccinated should get 2 doses of rabies vaccine: one  right away and another on the 3rd day. Rabies Immune Globulin is not needed. TELL YOUR DOCTOR IF: Talk with a doctor before getting rabies vaccine if you:  Ever had a serious (life-threatening) allergic reaction to a previous dose of rabies vaccine or to any component of the vaccine; tell your doctor if you have any severe allergies.  Have a weakened immune system because of:  HIV, AIDS, or another disease that affects the immune system.  Treatment with drugs that affect the immune system, such as steroids.  Cancer or cancer treatment with radiation or drugs. If you have a minor illness, such as a cold, you can be vaccinated. If you are moderately or severely ill, you should probably wait until you recover before getting a routine (non-exposure) dose of rabies vaccine. If you have been exposed to rabies virus, you should get the vaccine regardless of any other illnesses you may have. WHAT ARE THE RISKS FROM RABIES VACCINE? A vaccine, like any medicine, is capable of causing serious problems, such as severe allergic reactions. The risk of a vaccine causing serious harm, or death, is extremely small. Serious problems from rabies vaccine are very rare.  Mild problems:  Soreness, redness, swelling, or itching where the shot was given (30% to 74%).  Headache, nausea, abdominal pain, muscle aches, or dizziness (5% to 40%). Moderate problems:  Hives, pain in the joints, or fever (about 6% of booster doses).  Other nervous system disorders, such as Guillain-Barr Syndrome (GBS), have been reported after rabies vaccine, but this happens so rarely that it is not known whether they are related to the vaccine. Note: Several brands of rabies vaccine are available in the [place], and reactions may vary between brands. Your provider can give you more information about a particular brand. WHAT IF THERE IS A SERIOUS REACTION? What should I look for? Look for anything that concerns you, such as  signs of a severe allergic reaction, very high fever, or behavior changes.  Signs of a severe allergic reaction can include hives, swelling of the face and throat, difficulty breathing, a fast heartbeat, dizziness, and weakness. These would start a few minutes to a few hours after the vaccination. What should I do?  If you think it is a severe allergic reaction or other emergency that cannot wait, call 911 or get the person to the nearest hospital. Otherwise, call your doctor.  Afterward, the reaction should be reported to the Vaccine Adverse Event Reporting System (VAERS). Your doctor might file this report, or you can do it yourself through the VAERS website at www.vaers.LAgents.no or by calling 1-(820)462-0691. VAERS is only for reporting reactions. They do not give medical advice. HOW CAN I LEARN MORE?  Ask your doctor.  Call your local or state health department.  Contact the Centers for Disease Control and Prevention (CDC):  Visit the CDC rabies website at wwwcrv.com CDC Rabies Vaccine VIS (03/26/08) Document Released: 04/04/2006 Document Revised: 05/24/2012 Document Reviewed: 09/27/2012 [facility] Patient Information 2015 [place], [place]. This information is not intended to replace advice given to you by your health care provider. Make sure you discuss any questions you have with your health care provider.

## 2014-07-19 ENCOUNTER — Encounter (HOSPITAL_COMMUNITY): Payer: Self-pay | Admitting: Emergency Medicine

## 2014-07-19 ENCOUNTER — Emergency Department (INDEPENDENT_AMBULATORY_CARE_PROVIDER_SITE_OTHER)
Admission: EM | Admit: 2014-07-19 | Discharge: 2014-07-19 | Disposition: A | Discharge status: Home / Self Care | Payer: Managed Care, Other (non HMO) | Source: Home / Self Care | Attending: Emergency Medicine | Admitting: Emergency Medicine

## 2014-07-19 DIAGNOSIS — Z203 Contact with and (suspected) exposure to rabies: Secondary | ICD-10-CM | POA: Diagnosis not present

## 2014-07-19 MED ORDER — RABIES VACCINE, PCEC IM SUSR
1.0000 mL | Freq: Once | INTRAMUSCULAR | Status: AC
  Administered 2014-07-19: 1 mL via INTRAMUSCULAR

## 2014-07-19 MED ORDER — RABIES VACCINE, PCEC IM SUSR
INTRAMUSCULAR | Status: AC
  Filled 2014-07-19: qty 1

## 2014-07-19 NOTE — Discharge Instructions (Signed)
Please return for 5th and last rabies injection on 08/02/2014

## 2014-07-19 NOTE — ED Notes (Signed)
Pt states that he is here for his 4th rabies vaccine.

## 2014-08-02 ENCOUNTER — Encounter (HOSPITAL_COMMUNITY): Payer: Self-pay | Admitting: *Deleted

## 2014-08-02 ENCOUNTER — Emergency Department (HOSPITAL_COMMUNITY)
Admission: EM | Admit: 2014-08-02 | Discharge: 2014-08-02 | Disposition: A | Discharge status: Home / Self Care | Payer: Managed Care, Other (non HMO) | Source: Home / Self Care

## 2014-08-02 DIAGNOSIS — Z203 Contact with and (suspected) exposure to rabies: Secondary | ICD-10-CM

## 2014-08-02 MED ORDER — RABIES VACCINE, PCEC IM SUSR
1.0000 mL | Freq: Once | INTRAMUSCULAR | Status: AC
  Administered 2014-08-02: 1 mL via INTRAMUSCULAR

## 2014-08-02 MED ORDER — RABIES VACCINE, PCEC IM SUSR
INTRAMUSCULAR | Status: AC
  Filled 2014-08-02: qty 1

## 2014-08-02 NOTE — ED Notes (Signed)
Here for last rabies vaccine. Had bats in the attic and had what appeared to be a bite to L forearm.  No side effects from vaccine.

## 2014-08-02 NOTE — Discharge Instructions (Signed)
Call if any problems.  If any future rabies exposures, you should only need 1 or 2 booster shots.

## 2015-04-22 ENCOUNTER — Encounter: Payer: Self-pay | Admitting: Sports Medicine

## 2015-04-22 ENCOUNTER — Ambulatory Visit (INDEPENDENT_AMBULATORY_CARE_PROVIDER_SITE_OTHER): Payer: Managed Care, Other (non HMO) | Admitting: Sports Medicine

## 2015-04-22 VITALS — BP 145/88 | HR 56 | Ht 70.0 in | Wt 175.0 lb

## 2015-04-22 DIAGNOSIS — Z21 Asymptomatic human immunodeficiency virus [HIV] infection status: Secondary | ICD-10-CM | POA: Diagnosis not present

## 2015-04-22 DIAGNOSIS — G5701 Lesion of sciatic nerve, right lower limb: Secondary | ICD-10-CM

## 2015-04-22 MED ORDER — GABAPENTIN 300 MG PO CAPS: 300.0000 mg | ORAL_CAPSULE | Freq: Every day | ORAL | Status: DC

## 2015-04-22 NOTE — Patient Instructions (Signed)
Please make a follow-up appointment in 4-6 weeks to reassess your response to the therapy below  Rehabilitation stretching: -Please perform stretching on the outlined handouts for the piriformis muscle; these include stretching on your back as well as stretching on your side as reviewed with Dr. Darrick Pennafields  Strengthening rehabilitation: -With a light ankle weight (no more than 2 to 5 pounds) please perform hip raises and external rotation exercises for 3 sets of 10 at least 5 days a week as reviewed with Dr. Darrick Pennafields  Medication therapy: -Please use 300 mg (one tablet) of gabapentin every night until your follow-up appointment.

## 2015-04-22 NOTE — Progress Notes (Signed)
Subjective:    Patient ID: Gary Acevedo, male    DOB: 22-Nov-1960, 54 y.o.   MRN: [id number]   Patient referred courtesy of Dr Renne CriglerPharr  HPI 54 year old male with past medical history detailed below presenting for a FOUR MONTH history of right gluteal/posterior thigh electrical pain, which has been significantly worse over the last 9-10 days. Patient wears his wallet in the left posterior pocket. He works at a desk all day long. Patient reports that within 15-20 minutes of being seated in his car and about 2 hours of being seated at work, he has a onset of a sharp, electrical pain in the right mid buttocks that extends down the medial aspect of the posterior thigh and stops at the popliteal fossa. Time duration to symptom onset is the same now but in the last 9-10 days it is more severe pain.  ROS Denies any recent falls, trauma or new exercise regimen. Denies any weakness or instability. Denies any other paresthesia. Denies bowel or bladder incontinence. Denies any erectile dysfunction or new sexual complaints.  Patient denies any other symptoms in either lower extremity with the exception of a history of a sharp, electrical type pain in the plantar aspect of his right foot, along the third/fourth metatarsal head area. Patient currently does not have symptoms there but will get symptoms periodically when walking.   PMHx: -- Reviewed -HIV positive with undetectable viral load 20 years; most recent viral load in July -Hypertension, OSA on CPAP and anxiety/panic disorder  PSHx: -- Reviewed; UPPP for surgical attempt at OSA correction  Meds: -- Reviewed; unchanged triple heart therapy for HIV; also on Celexa and antihypertensive  Allergies: -- Reviewed  Social Hx: -- Reviewed;  Works in [employer] with a lot of sitting.  Review of Systems  10-Point ROS was negative other than ROS above in HPI    Objective:   Physical Exam   General: -- Well appearing and in NAD; resting comfortably on  exam table/ athletic and muscular BP 145/88 mmHg  Pulse 56  Ht 5\' 10"  (1.778 m)  Wt 175 lb (79.379 kg)  BMI 25.11 kg/m2   Cardiopulmonary: -- Non-labored respirations -- No JVD; 2+ pulses in distal lower extremities  Skin/derm: -- No rashes present on the examined skin of abdomen/chest/back and the extremities  Neurology: -- No focal deficits on interview/exam -- Neurovascular intact in examined extremities  Psych: -- Appropriate mood/affect; normal thought content Back exam: -Excellent range of motion in lumbar flexion/extension, and bilateral rotation and side bending. All movements are pain-free. -Negative slump test. Negative straight leg raise, bilaterally. -No paraspinal spasm and no tenderness to pinous process palpation bilaterally   Lower extremity exam: -Patient with no abnormalities on inspection of bilateral lower extremities and lumbar spine -5/5 strength in hip flexion/extension, internal rotation/external rotation and hip abduction/adduction -5/5 strength in all lower extremity myotomes bilaterally with great toe extension, ankle plantar flexion and knee extension in addition to above -Sensation intact over bilateral lower extremity dermatomes  -(POSITIVE TINEL'S OF SCIATIC NERVE) -Tender to palpation/reproduction of radicular electrical pain with palpation in the area of the distal one third of the piriformis (area from greater trochanter to sacrum) -Radicular symptoms worsen with palpation of that area during resisted external rotation  + pirifromis pain with Xover stretcha nd with pretzel stretch     Assessment & Plan:    54 year old athletic male with well-controlled comorbid medical problems presenting to sports medicine clinic for radicular pain of his right posterior thigh.  #  1. Piriformis syndrome with resultant sciatica/radicular symptoms Patient with classic historical and examination findings of piriformis syndrome, significant enough to now have  a positive Tinel sign of the sciatic nerve with palpation of the lateral one third of the piriformis. The patient has a negative/normal lumbar spine exam and no evidence of disc disease. The remainder of his hip exam is normal with the exception of pertinent positives discussed above concerning for piriformis syndrome. Relevant anatomy of the area and typical rehabilitation was discussed with the patient. Medication for symptom control was also discussed.  Plan: -Stretching program and strengthening program of the piriformis muscle as detailed in the patient instructions -Gabapentin 300 mg daily at bedtime for assistance with nerve pain/sleep -Tennis ball  seated massageat least 2 times daily, especially when driving -Follow up and sports medicine Center in 4-6 weeks, with Dr. Margaretha Sheffield or another provider as Dr. Darrick Penna will be out for surgery

## 2015-04-22 NOTE — Assessment & Plan Note (Signed)
See plan in OV 

## 2015-05-26 ENCOUNTER — Encounter: Payer: Self-pay | Admitting: Sports Medicine

## 2015-05-26 ENCOUNTER — Ambulatory Visit (INDEPENDENT_AMBULATORY_CARE_PROVIDER_SITE_OTHER): Payer: Managed Care, Other (non HMO) | Admitting: Sports Medicine

## 2015-05-26 VITALS — BP 128/80 | Ht 70.0 in | Wt 175.0 lb

## 2015-05-26 DIAGNOSIS — G5701 Lesion of sciatic nerve, right lower limb: Secondary | ICD-10-CM

## 2015-05-26 NOTE — Progress Notes (Signed)
   Subjective:    Patient ID: Servando Salinahomas E Wegman, male    DOB: 03-04-1961, 54 y.o.   MRN: [id number]  HPI Mr. Freida Busmanllen is a 54yo male presenting today for follow up of right piriformis syndrome. - Previously seen on 04/22/15 with four month history of right gluteal and posterior thigh electrical pain. Pain occurred daily at work and while in cars. - Notes significant improvement of pain. Pain now only occurs with long car trips to [place]. Denies pain during regular work days. - Admits he has not been compliant with recommendations made at last visit due to upper respiratory tract infection and mother being in hospital and then nursing home in [place] - Has only used Gabapentin once. Denies use of other over the counter medications for pain. - Has not been using tennis ball while riding in car - Was compliant with exercises given for 1-2 weeks with improvement in pain - Denies any further concerns or complaints today  Review of Systems Per HPI    Objective:   Physical Exam General: 54yo male resting comfortably in no apparent distress Cards: Lower extremities warm and well perfused Resp: No increased work of breathing noted Musc: Muscle strength 5/5 in lower extremities bilaterally. No decreased sensation. No tenderness over left piriformis, however notes mild nerve pain with palpation of right piriformis. Normal gait. Normal ROM of hips and knees bilaterally. No tenderness to SI joints or thoracic and lumbar spine.      Assessment & Plan:  # 1: Right Piriformis Syndrome with Sciatic Symptoms: Improved from last office visit on 11/1. Now only reports symptoms with long car rides to [place]. Improvement with exercises given, however noncompliant with Gabapentin and tennis ball.  Plan: - Continue stretching and strengthening programs of Piriformis given at last office visit. - Recommend tennis ball for car rides to help with pain - Hold gabapentin for now. If pain fails to further  improve or worsens, may consider restarting - Follow up as needed. If pain recurs or worsens, consider imaging of lumbar spine.  Patient seen and evaluated with the resident. I agree with the above plan of care.

## 2015-09-07 ENCOUNTER — Encounter (HOSPITAL_COMMUNITY): Payer: Self-pay | Admitting: *Deleted

## 2015-09-07 ENCOUNTER — Emergency Department (INDEPENDENT_AMBULATORY_CARE_PROVIDER_SITE_OTHER)
Admission: EM | Admit: 2015-09-07 | Discharge: 2015-09-07 | Disposition: A | Discharge status: Home / Self Care | Payer: Managed Care, Other (non HMO) | Source: Home / Self Care | Attending: Emergency Medicine | Admitting: Emergency Medicine

## 2015-09-07 DIAGNOSIS — B9789 Other viral agents as the cause of diseases classified elsewhere: Secondary | ICD-10-CM

## 2015-09-07 DIAGNOSIS — B349 Viral infection, unspecified: Secondary | ICD-10-CM | POA: Diagnosis not present

## 2015-09-07 DIAGNOSIS — J988 Other specified respiratory disorders: Principal | ICD-10-CM

## 2015-09-07 NOTE — ED Notes (Signed)
Assessment per F. Patrick, PA. 

## 2015-09-07 NOTE — ED Provider Notes (Signed)
CSN: [id number]     Arrival date & time 09/07/15  1926 History   First MD Initiated Contact with Patient 09/07/15 2101     Chief Complaint  Patient presents with  . Fever  . Cough   (Consider location/radiation/quality/duration/timing/severity/associated sxs/prior Treatment) HPI Pt presents with sore throat, body aches, fever, chills for  3 days Home treatment has been OTC meds without much relief of symptoms Fever is improved for short periods of time with OTC antipyretics. Pain score is 4 mostly from coughing and body aches Taking fluids, no appetite Flu shot in October Cough caused abdominal muscles to hurt Has been exposed to others with similar symptoms.  Denies: CP, SOB, vomiting or diarrhea.  Past Medical History  Diagnosis Date  . Near syncope   . OSA (obstructive sleep apnea)   . Hypertension   . HIV (human immunodeficiency virus infection) (HCC)   . Hypercholesteremia   . Dizziness   . Dyslipidemia   . Palpitations   . Anxiety    Past Surgical History  Procedure Laterality Date  . Tonsillectomy  2004  . Uvulopalatopharyngoplasty  2004   Family History  Problem Relation Age of Onset  . COPD Mother   . Hypertension Mother   . Hypertension Father   . Hyperlipidemia Father    Social History  Substance Use Topics  . Smoking status: Never Smoker   . Smokeless tobacco: None  . Alcohol Use: Yes     Comment: occasional    Review of Systems See HPI Allergies  Amoxicillin and Sulfa drugs cross reactors  Home Medications   Prior to Admission medications   Medication Sig Start Date End Date Taking? Authorizing Provider  abacavir (ZIAGEN) 300 MG tablet Take 300 mg by mouth 2 (two) times daily.   Yes Historical Provider, MD  citalopram (CELEXA) 10 MG tablet Take 10 mg by mouth daily.   Yes Historical Provider, MD  lamiVUDine (EPIVIR) 150 MG tablet Take 150 mg by mouth 2 (two) times daily.   Yes Historical Provider, MD  nevirapine (VIRAMUNE) 200 MG tablet  Take 200 mg by mouth 2 (two) times daily.   Yes Historical Provider, MD  Ritonavir (NORVIR PO) Take by mouth.   Yes Historical Provider, MD  ALPRAZolam Prudy Feeler) 0.5 MG tablet Take 0.5 mg by mouth at bedtime as needed.    Historical Provider, MD  amLODipine (NORVASC) 5 MG tablet Take 5 mg by mouth daily.    Historical Provider, MD  aspirin 81 MG tablet Take 81 mg by mouth daily.    Historical Provider, MD  BROMFED DM 30-2-10 MG/5ML syrup  05/20/15   Historical Provider, MD  clarithromycin (BIAXIN) 500 MG tablet  05/20/15   Historical Provider, MD  gabapentin (NEURONTIN) 300 MG capsule Take 1 capsule (300 mg total) by mouth at bedtime. 04/22/15   Enid Baas, MD  ketoconazole (NIZORAL) 2 % cream Apply topically. 01/07/15   Historical Provider, MD   Meds Ordered and Administered this Visit  Medications - No data to display  BP 140/87 mmHg  Pulse 53  Temp(Src) 99.6 F (37.6 C) (Oral)  Resp 18  SpO2 97% No data found.   Physical Exam NURSES NOTES AND VITAL SIGNS REVIEWED. CONSTITUTIONAL: Well developed, well nourished, no acute distress HEENT: normocephalic, atraumatic, right and left TM's are normal EYES: Conjunctiva normal NECK:normal ROM, supple, no adenopathy PULMONARY:No respiratory distress, normal effort, Lungs: CTAb/l, no wheezes, or increased work of breathing CARDIOVASCULAR: RRR, no murmur ABDOMEN: soft, ND, NT, +'ve BS MUSCULOSKELETAL:  Normal ROM of all extremities,  SKIN: warm and dry without rash PSYCHIATRIC: Mood and affect, behavior are normal  ED Course  Procedures (including critical care time)  Labs Review Labs Reviewed - No data to display  Imaging Review No results found.   Visual Acuity Review  Right Eye Distance:   Left Eye Distance:   Bilateral Distance:    Right Eye Near:   Left Eye Near:    Bilateral Near:         MDM   1. Viral respiratory illness     Patient is reassured that there are no issues that require transfer to higher level of  care at this time or additional tests. Patient is advised to continue home symptomatic treatment. Patient is advised that if there are new or worsening symptoms to attend the emergency department, contact primary care provider, or return to UC. Instructions of care provided discharged home in stable condition. Return to work/school note provided.   THIS NOTE WAS GENERATED USING A VOICE RECOGNITION SOFTWARE PROGRAM. ALL REASONABLE EFFORTS  WERE MADE TO PROOFREAD THIS DOCUMENT FOR ACCURACY.  I have verbally reviewed the discharge instructions with the patient. A printed AVS was given to the patient.  All questions were answered prior to discharge.      Tharon AquasFrank C Patrick, PA 09/07/15 2110  Tharon AquasFrank C Patrick, PA 09/07/15 2120

## 2015-09-07 NOTE — Discharge Instructions (Signed)
Viral Infections °A viral infection can be caused by different types of viruses. Most viral infections are not serious and resolve on their own. However, some infections may cause severe symptoms and may lead to further complications. °SYMPTOMS °Viruses can frequently cause: °· Minor sore throat. °· Aches and pains. °· Headaches. °· Runny nose. °· Different types of rashes. °· Watery eyes. °· Tiredness. °· Cough. °· Loss of appetite. °· Gastrointestinal infections, resulting in nausea, vomiting, and diarrhea. °These symptoms do not respond to antibiotics because the infection is not caused by bacteria. However, you might catch a bacterial infection following the viral infection. This is sometimes called a "superinfection." Symptoms of such a bacterial infection may include: °· Worsening sore throat with pus and difficulty swallowing. °· Swollen neck glands. °· Chills and a high or persistent fever. °· Severe headache. °· Tenderness over the sinuses. °· Persistent overall ill feeling (malaise), muscle aches, and tiredness (fatigue). °· Persistent cough. °· Yellow, green, or brown mucus production with coughing. °HOME CARE INSTRUCTIONS  °· Only take over-the-counter or prescription medicines for pain, discomfort, diarrhea, or fever as directed by your caregiver. °· Drink enough water and fluids to keep your urine clear or pale yellow. Sports drinks can provide valuable electrolytes, sugars, and hydration. °· Get plenty of rest and maintain proper nutrition. Soups and broths with crackers or rice are fine. °SEEK IMMEDIATE MEDICAL CARE IF:  °· You have severe headaches, shortness of breath, chest pain, neck pain, or an unusual rash. °· You have uncontrolled vomiting, diarrhea, or you are unable to keep down fluids. °· You or your child has an oral temperature above 102° F (38.9° C), not controlled by medicine. °· Your baby is older than 3 months with a rectal temperature of 102° F (38.9° C) or higher. °· Your baby is 3  months old or younger with a rectal temperature of 100.4° F (38° C) or higher. °MAKE SURE YOU:  °· Understand these instructions. °· Will watch your condition. °· Will get help right away if you are not doing well or get worse. °  °This information is not intended to replace advice given to you by your health care provider. Make sure you discuss any questions you have with your health care provider. °  °Document Released: 03/17/2005 Document Revised: 08/30/2011 Document Reviewed: 11/13/2014 °Elsevier Interactive Patient Education ©2016 [employer] Inc[employer] ° °

## 2015-10-22 ENCOUNTER — Encounter (HOSPITAL_COMMUNITY): Payer: Self-pay | Admitting: *Deleted

## 2015-10-22 ENCOUNTER — Ambulatory Visit (HOSPITAL_COMMUNITY)
Admission: EM | Admit: 2015-10-22 | Discharge: 2015-10-22 | Disposition: A | Discharge status: Home / Self Care | Payer: Managed Care, Other (non HMO) | Attending: Family Medicine | Admitting: Family Medicine

## 2015-10-22 DIAGNOSIS — H6122 Impacted cerumen, left ear: Secondary | ICD-10-CM

## 2015-10-22 NOTE — ED Provider Notes (Signed)
CSN: [id number]     Arrival date & time 10/22/15  1807 History   First MD Initiated Contact with Patient 10/22/15 1927     Chief Complaint  Patient presents with  . Otalgia   (Consider location/radiation/quality/duration/timing/severity/associated sxs/prior Treatment) Patient is a 55 y.o. male presenting with plugged ear sensation. The history is provided by the patient.  Ear Fullness This is a new problem. The current episode started 6 to 12 hours ago. The problem has not changed since onset.Pertinent negatives include no headaches.    Past Medical History  Diagnosis Date  . Near syncope   . OSA (obstructive sleep apnea)   . Hypertension   . HIV (human immunodeficiency virus infection) (HCC)   . Hypercholesteremia   . Dizziness   . Dyslipidemia   . Palpitations   . Anxiety    Past Surgical History  Procedure Laterality Date  . Tonsillectomy  2004  . Uvulopalatopharyngoplasty  2004   Family History  Problem Relation Age of Onset  . COPD Mother   . Hypertension Mother   . Hypertension Father   . Hyperlipidemia Father    Social History  Substance Use Topics  . Smoking status: Never Smoker   . Smokeless tobacco: None  . Alcohol Use: Yes     Comment: occasional    Review of Systems  Constitutional: Negative.   HENT: Positive for ear pain. Negative for ear discharge, facial swelling, hearing loss and postnasal drip.   Neurological: Negative for headaches.  All other systems reviewed and are negative.   Allergies  Amoxicillin and Sulfa drugs cross reactors  Home Medications   Prior to Admission medications   Medication Sig Start Date End Date Taking? Authorizing Provider  abacavir (ZIAGEN) 300 MG tablet Take 300 mg by mouth 2 (two) times daily.    Historical Provider, MD  ALPRAZolam Prudy Feeler(XANAX) 0.5 MG tablet Take 0.5 mg by mouth at bedtime as needed.    Historical Provider, MD  amLODipine (NORVASC) 5 MG tablet Take 5 mg by mouth daily.    Historical Provider, MD    aspirin 81 MG tablet Take 81 mg by mouth daily.    Historical Provider, MD  BROMFED DM 30-2-10 MG/5ML syrup  05/20/15   Historical Provider, MD  citalopram (CELEXA) 10 MG tablet Take 10 mg by mouth daily.    Historical Provider, MD  clarithromycin (BIAXIN) 500 MG tablet  05/20/15   Historical Provider, MD  gabapentin (NEURONTIN) 300 MG capsule Take 1 capsule (300 mg total) by mouth at bedtime. 04/22/15   Enid BaasKarl Fields, MD  ketoconazole (NIZORAL) 2 % cream Apply topically. 01/07/15   Historical Provider, MD  lamiVUDine (EPIVIR) 150 MG tablet Take 150 mg by mouth 2 (two) times daily.    Historical Provider, MD  nevirapine (VIRAMUNE) 200 MG tablet Take 200 mg by mouth 2 (two) times daily.    Historical Provider, MD  Ritonavir (NORVIR PO) Take by mouth.    Historical Provider, MD   Meds Ordered and Administered this Visit  Medications - No data to display  BP 155/88 mmHg  Pulse 60  Temp(Src) 98.2 F (36.8 C) (Oral)  SpO2 98% No data found.   Physical Exam  Constitutional: He is oriented to person, place, and time. He appears well-developed and well-nourished. No distress.  HENT:  Right Ear: External ear normal.  Ears:  Mouth/Throat: Oropharynx is clear and moist.  Cerumen impaction on left.  Eyes: Pupils are equal, round, and reactive to light.  Neck: Normal range  of motion. Neck supple.  Lymphadenopathy:    He has no cervical adenopathy.  Neurological: He is alert and oriented to person, place, and time.  Skin: Skin is warm and dry.  Nursing note and vitals reviewed.   ED Course  Procedures (including critical care time)  Labs Review Labs Reviewed - No data to display  Imaging Review No results found.   Visual Acuity Review  Right Eye Distance:   Left Eye Distance:   Bilateral Distance:    Right Eye Near:   Left Eye Near:    Bilateral Near:         MDM   1. Cerumen impaction, left    Sx improved and canals and tm nl after irrig.     Linna Hoff,  MD 10/22/15 2002

## 2015-10-22 NOTE — ED Notes (Signed)
Pt  Reports   Symptoms   OF   Stopped    Up  l   Ear      She  Reports       Symptoms      Not  releived   By  Debrox

## 2016-04-07 ENCOUNTER — Ambulatory Visit (INDEPENDENT_AMBULATORY_CARE_PROVIDER_SITE_OTHER): Payer: Managed Care, Other (non HMO) | Admitting: Sports Medicine

## 2016-04-07 ENCOUNTER — Ambulatory Visit: Payer: Self-pay

## 2016-04-07 ENCOUNTER — Encounter: Payer: Self-pay | Admitting: Sports Medicine

## 2016-04-07 VITALS — BP 135/79 | Ht 70.0 in | Wt 177.0 lb

## 2016-04-07 DIAGNOSIS — M67912 Unspecified disorder of synovium and tendon, left shoulder: Secondary | ICD-10-CM | POA: Diagnosis not present

## 2016-04-07 DIAGNOSIS — M7712 Lateral epicondylitis, left elbow: Secondary | ICD-10-CM

## 2016-04-07 DIAGNOSIS — M25512 Pain in left shoulder: Secondary | ICD-10-CM | POA: Diagnosis not present

## 2016-04-07 MED ORDER — NITROGLYCERIN 0.2 MG/HR TD PT24
MEDICATED_PATCH | TRANSDERMAL | 1 refills | Status: DC
Start: 2016-04-07 — End: 2018-03-27

## 2016-04-07 NOTE — Progress Notes (Signed)
CC: left shoulder and left elbow pain  Referral: courtesy of Dr. Merri BrunetteWalter Pharr  Patient complains of 8 months of left shoulder pain Heel lifts weights on a fairly regular basis He first noted the pain on benchpress and incline presses He decreased his lifting has taken some NSAIDs but does not note much improvement He gets nighttime pain He gets pain in certain motions  For the past 3 months his left elbow near the lateral epicondyle has been painful as well He noticed this first with curls He sometimes gets pain with regular daily activities  Past history HIV positive and he remains on a triple drug regimen that has kept his counts undetectable Piriformis syndrome - he responded to the exercises and stretches we've given him  Social history Nonsmoker Work for [employer] [place]  Review of systems No neck pain No radicular symptoms into either arm No numbness in either extremity Denies weakness in the hands or forearms Positive for nocturnal pain in the shoulder This particularly wakens him up if he rolls over into certain positions  PE Muscular male in no acute distress BP 135/79   Ht 5\' 10"  (1.778 m)   Wt 177 lb (80.3 kg)   BMI 25.40 kg/m   Left Shoulder: Inspection reveals no abnormalities, atrophy or asymmetry. Palpation is normal with no tenderness over AC joint or bicipital groove. ROM is full in all planes. Rotator cuff strength normal throughout. Positive signs of impingement with  Neer and  empty can.  Able to resist testing on Hawkin's Speeds and Yergason's tests normal. No labral pathology noted with negative Obrien's, negative clunk and good stability. Normal scapular function observed. No painful arc and no drop arm sign. No apprehension sign  Left Elbow There is no well-defined area of point tenderness He has slight discomfort in his proximal muscles near the lateral epicondyle Full range of motion He can resist with normal strength and passes the  book test  Ultrasound of Shoulder-Left  BT short-norm BT long-norm Supraspinatus tendon- Large calcific deposit noted in mid tendon that is linear. 3 smaller calcific spurs noted near the insertion on the footplate Subscapularis tendon-norm Infraspinatus tendon-norm Teres Minor tendon-norm AC joint: there is mild calcification but no narrowing  Summary and Additional findings- Doppler flow is consistently increased near the calcifications. 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 There has not any evidence of a rotator cuff tear at this point  US of left lateral elbow There is calcific spurring at the insertion to the lateral epicondyle and a large linear calcification in the common extensor tendons There is significantly increased Doppler flow  Summary: This consistent with calcific lateral epicondylitis  Ultrasound scans and interpretation by Leandrew KoyanagiKarl Lucette Kratz M.D.

## 2016-04-07 NOTE — Assessment & Plan Note (Signed)
Standard home exercise program Emphasis on repetitions and low weight Trial on a nitroglycerin protocol  Recheck and repeat scan in 6 weeks

## 2016-04-07 NOTE — Patient Instructions (Signed)
Please do elbow exercises as discussed  Start the nitroglycerin  Nitroglycerin Protocol   Apply 1/4 nitroglycerin patch to affected area daily.  Change position of patch within the affected area every 24 hours.  You may experience a headache during the first 1-2 weeks of using the patch, these should subside.  If you experience headaches after beginning nitroglycerin patch treatment, you may take your preferred over the counter pain reliever.  Another side effect of the nitroglycerin patch is skin irritation or rash related to patch adhesive.  Please notify our office if you develop more severe headaches or rash, and stop the patch.  Tendon healing with nitroglycerin patch may require 12 to 24 weeks depending on the extent of injury.  Men should not use if taking Viagra, Cialis, or Levitra.   Do not use if you have migraines or rosacea.

## 2016-04-07 NOTE — Assessment & Plan Note (Signed)
Home exercise program to focus on the supraspinatous Avoid positions of impingement Nitroglycerin protocol  We may need to consider injection and or barbotage of the calcifications under ultrasound guidance utilizing lidocaine, saline and triamcinolone  We will reserve that procedure if he fails conservative care

## 2016-05-19 ENCOUNTER — Encounter: Payer: Self-pay | Admitting: Sports Medicine

## 2016-05-19 ENCOUNTER — Ambulatory Visit (INDEPENDENT_AMBULATORY_CARE_PROVIDER_SITE_OTHER): Payer: Managed Care, Other (non HMO) | Admitting: Sports Medicine

## 2016-05-19 DIAGNOSIS — M67912 Unspecified disorder of synovium and tendon, left shoulder: Secondary | ICD-10-CM | POA: Diagnosis not present

## 2016-05-19 DIAGNOSIS — M7712 Lateral epicondylitis, left elbow: Secondary | ICD-10-CM

## 2016-05-19 NOTE — Patient Instructions (Signed)
Stop nitroglycerine  For rotator cuff - focus 5 exercises 1. Upright row 2. And 3. Spokes of a wheel all the way overhead - 2 positions palm down in front and palm up on side 4. Cross body external rotation 5. Diagonal lift  Elbow  Keep up wrist curls but increase the weight Keep up forearm rolls but increase the weight  My prediction is that in 6 weeks you should be 90% healed Then it is OK to resume normal training  Int he interim you can do reduced training as long as pain level is less than 3/10

## 2016-05-19 NOTE — Progress Notes (Signed)
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 Palpation is normal with no tenderness over AC joint or bicipital groove. ROM is full in all planes. Feels slight pain with back scratch Rotator cuff strength normal throughout. Improvement in signs of impingement with negative Neer and Hawkin's tests, empty can With the exception that the empty can and Hawkins test cause mild pain Speeds and Yergason's tests normal. No labral pathology noted with negative Obrien's, negative clunk and good stability. Normal scapular function observed. No painful arc and no drop arm sign. No apprehension sign  Left elbows Now nontender to palpation Excellent strength on wrist extension Excellent strength on finger extension Book test is now normal

## 2016-05-19 NOTE — Assessment & Plan Note (Signed)
Continues to make good progress  I advised him to expand his home rehabilitation Add more weight  In 6 weeks he should be improved enough to go back to weight lifting  If not he should return to see me

## 2016-05-19 NOTE — Assessment & Plan Note (Signed)
.   Much improved after home exercise and nitroglycerin protocol  Continue with home exercises for the next 6 weeks   can gradually return to weight lifting

## 2017-10-03 ENCOUNTER — Encounter (HOSPITAL_COMMUNITY): Payer: Self-pay | Admitting: Family Medicine

## 2017-10-03 ENCOUNTER — Ambulatory Visit (HOSPITAL_COMMUNITY)
Admission: EM | Admit: 2017-10-03 | Discharge: 2017-10-03 | Disposition: A | Discharge status: Home / Self Care | Payer: 59 | Attending: Urgent Care | Admitting: Urgent Care

## 2017-10-03 DIAGNOSIS — K529 Noninfective gastroenteritis and colitis, unspecified: Secondary | ICD-10-CM

## 2017-10-03 DIAGNOSIS — R112 Nausea with vomiting, unspecified: Secondary | ICD-10-CM

## 2017-10-03 DIAGNOSIS — B20 Human immunodeficiency virus [HIV] disease: Secondary | ICD-10-CM

## 2017-10-03 MED ORDER — ONDANSETRON 4 MG PO TBDP
8.0000 mg | ORAL_TABLET | Freq: Once | ORAL | Status: AC
  Administered 2017-10-03: 8 mg via ORAL

## 2017-10-03 MED ORDER — ONDANSETRON 8 MG PO TBDP: 8.0000 mg | ORAL_TABLET | Freq: Three times a day (TID) | ORAL | 0 refills | Status: DC | PRN

## 2017-10-03 MED ORDER — ONDANSETRON 4 MG PO TBDP
ORAL_TABLET | ORAL | Status: AC
  Filled 2017-10-03: qty 1

## 2017-10-03 NOTE — ED Provider Notes (Signed)
MRN: [id number] DOB: 11-04-1960  Subjective:   Gary Acevedo is a 57 y.o. male with past medical history of HIV presenting for 1 day history of nausea with vomiting, epigastric pain.  Denies fever, bloody stools, diarrhea, constipation, dysuria, hematuria.  Patient tried Pepto tablets last night which he vomited.  He is trying to stay hydrated but can only tolerate small sips of water.  Denies history of GI diseases like ulcerative colitis, Crohn's disease.  No current facility-administered medications for this encounter.   Current Outpatient Medications:  .  abacavir (ZIAGEN) 300 MG tablet, Take 300 mg by mouth 2 (two) times daily., Disp: , Rfl:  .  ALPRAZolam (XANAX) 0.5 MG tablet, Take 0.5 mg by mouth at bedtime as needed., Disp: , Rfl:  .  amLODipine (NORVASC) 10 MG tablet, , Disp: , Rfl:  .  amLODipine (NORVASC) 5 MG tablet, Take 5 mg by mouth daily., Disp: , Rfl:  .  aspirin 81 MG tablet, Take 81 mg by mouth daily., Disp: , Rfl:  .  BROMFED DM 30-2-10 MG/5ML syrup, , Disp: , Rfl:  .  citalopram (CELEXA) 10 MG tablet, Take 10 mg by mouth daily., Disp: , Rfl:  .  citalopram (CELEXA) 20 MG tablet, , Disp: , Rfl:  .  clarithromycin (BIAXIN) 500 MG tablet, , Disp: , Rfl:  .  gabapentin (NEURONTIN) 300 MG capsule, Take 1 capsule (300 mg total) by mouth at bedtime., Disp: 30 capsule, Rfl: 2 .  ketoconazole (NIZORAL) 2 % cream, Apply topically., Disp: , Rfl:  .  lamiVUDine (EPIVIR) 150 MG tablet, Take 150 mg by mouth 2 (two) times daily., Disp: , Rfl:  .  nevirapine (VIRAMUNE) 200 MG tablet, Take 200 mg by mouth 2 (two) times daily., Disp: , Rfl:  .  nitroGLYCERIN (NITRODUR - DOSED IN MG/24 HR) 0.2 mg/hr patch, Place 1/4 patch to affected area daily, Disp: 30 patch, Rfl: 1 .  Ritonavir (NORVIR PO), Take by mouth., Disp: , Rfl:    Allergies  Allergen Reactions  . Amoxicillin Rash  . Sulfa Drugs Cross Reactors Itching and Rash    Past Medical History:  Diagnosis Date  . Anxiety   .  Dizziness   . Dyslipidemia   . HIV (human immunodeficiency virus infection) (HCC)   . Hypercholesteremia   . Hypertension   . Near syncope   . OSA (obstructive sleep apnea)   . Palpitations      Past Surgical History:  Procedure Laterality Date  . TONSILLECTOMY  2004  . UVULOPALATOPHARYNGOPLASTY  2004    Objective:   Vitals: BP 137/72 (BP Location: Left Arm)   Pulse 65   Temp 99.8 F (37.7 C) (Oral)   Resp 18   SpO2 98%   Physical Exam  Constitutional: He is oriented to person, place, and time. He appears well-developed and well-nourished.  HENT:  Mucous membranes dry.  Cardiovascular: Normal rate, regular rhythm and intact distal pulses. Exam reveals no gallop and no friction rub.  No murmur heard. Pulmonary/Chest: No respiratory distress. He has no wheezes. He has no rales.  Abdominal: Soft. Bowel sounds are normal. He exhibits no distension and no mass. There is no tenderness. There is no rebound and no guarding.  Neurological: He is alert and oriented to person, place, and time.  Skin: Skin is warm and dry.  Psychiatric: He has a normal mood and affect.    Assessment and Plan :   Nausea and vomiting, intractability of vomiting not specified, unspecified vomiting  type  Gastroenteritis  HIV disease (HCC)  We will manage for viral gastroenteritis with supportive care.  Patient can use Zofran, recommended hydration with water and Gatorade. Counseled patient on potential for adverse effects with medications prescribed today, patient verbalized understanding. Return-to-clinic precautions discussed, patient verbalized understanding.    Wallis BambergMani, Dilana Mcphie, [place] JerseyPA-C 10/03/17 [phone]

## 2017-10-03 NOTE — Discharge Instructions (Signed)
Hydrate well with at least 2 liters (1 gallon) of water daily.  °

## 2017-10-03 NOTE — ED Triage Notes (Signed)
Pt here for N/V starting last night; pt last vomited this am and has held down some sips of water

## 2017-10-31 ENCOUNTER — Ambulatory Visit (INDEPENDENT_AMBULATORY_CARE_PROVIDER_SITE_OTHER): Payer: 59

## 2017-10-31 ENCOUNTER — Ambulatory Visit: Payer: 59 | Admitting: Podiatry

## 2017-10-31 ENCOUNTER — Encounter: Payer: Self-pay | Admitting: Podiatry

## 2017-10-31 DIAGNOSIS — M722 Plantar fascial fibromatosis: Secondary | ICD-10-CM | POA: Diagnosis not present

## 2017-10-31 DIAGNOSIS — M79671 Pain in right foot: Secondary | ICD-10-CM

## 2017-10-31 DIAGNOSIS — M79672 Pain in left foot: Secondary | ICD-10-CM

## 2017-10-31 MED ORDER — MELOXICAM 15 MG PO TABS: 15.0000 mg | ORAL_TABLET | Freq: Every day | ORAL | 0 refills | Status: DC

## 2017-10-31 NOTE — Progress Notes (Signed)
Subjective:    Patient ID: Gary Acevedo, male    DOB: 02-15-1961, 57 y.o.   MRN: [id number]  HPI 57 year old male presents the office with concerns of heel pain on the left side worse.  He states that about 6 to 8 months and hurting bottom of the heel.  He states that he is tried inserts that he got in [employer], product/process development.  He gets pain in the morning he first gets up or return to walk the dog.  He has no recent injury or trauma.  No swelling or redness that he has noticed.  The pain does not wake him up at night.  He has no other concerns today.   Review of Systems  All other systems reviewed and are negative.  Past Medical History:  Diagnosis Date  . Anxiety   . Dizziness   . Dyslipidemia   . HIV (human immunodeficiency virus infection) (HCC)   . Hypercholesteremia   . Hypertension   . Near syncope   . OSA (obstructive sleep apnea)   . Palpitations     Past Surgical History:  Procedure Laterality Date  . TONSILLECTOMY  2004  . UVULOPALATOPHARYNGOPLASTY  2004     Current Outpatient Medications:  .  abacavir (ZIAGEN) 300 MG tablet, Take 300 mg by mouth 2 (two) times daily., Disp: , Rfl:  .  ALPRAZolam (XANAX) 0.5 MG tablet, Take 0.5 mg by mouth at bedtime as needed., Disp: , Rfl:  .  amLODipine (NORVASC) 10 MG tablet, , Disp: , Rfl:  .  amLODipine (NORVASC) 5 MG tablet, Take 5 mg by mouth daily., Disp: , Rfl:  .  aspirin 81 MG tablet, Take 81 mg by mouth daily., Disp: , Rfl:  .  BROMFED DM 30-2-10 MG/5ML syrup, , Disp: , Rfl:  .  citalopram (CELEXA) 10 MG tablet, Take 10 mg by mouth daily., Disp: , Rfl:  .  citalopram (CELEXA) 20 MG tablet, , Disp: , Rfl:  .  clarithromycin (BIAXIN) 500 MG tablet, , Disp: , Rfl:  .  gabapentin (NEURONTIN) 300 MG capsule, Take 1 capsule (300 mg total) by mouth at bedtime., Disp: 30 capsule, Rfl: 2 .  ketoconazole (NIZORAL) 2 % cream, Apply topically., Disp: , Rfl:  .  lamiVUDine (EPIVIR) 150 MG tablet, Take 150 mg by mouth 2 (two) times daily.,  Disp: , Rfl:  .  nevirapine (VIRAMUNE) 200 MG tablet, Take 200 mg by mouth 2 (two) times daily., Disp: , Rfl:  .  nitroGLYCERIN (NITRODUR - DOSED IN MG/24 HR) 0.2 mg/hr patch, Place 1/4 patch to affected area daily, Disp: 30 patch, Rfl: 1 .  ondansetron (ZOFRAN-ODT) 8 MG disintegrating tablet, Take 1 tablet (8 mg total) by mouth every 8 (eight) hours as needed for nausea., Disp: 15 tablet, Rfl: 0 .  Ritonavir (NORVIR PO), Take by mouth., Disp: , Rfl:   Allergies  Allergen Reactions  . Amoxicillin Rash  . Sulfa Antibiotics Itching and Rash  . Sulfa Drugs Cross Reactors Itching and Rash    Social History   Socioeconomic History  . Marital status: Single    Spouse name: Not on file  . Number of children: Not on file  . Years of education: Not on file  . Highest education level: Not on file  Occupational History  . Not on file  Social Needs  . Financial resource strain: Not on file  . Food insecurity:    Worry: Not on file    Inability: Not on file  .  Transportation needs:    Medical: Not on file    Non-medical: Not on file  Tobacco Use  . Smoking status: Never Smoker  . Smokeless tobacco: Never Used  Substance and Sexual Activity  . Alcohol use: Yes    Comment: occasional  . Drug use: No  . Sexual activity: Not on file  Lifestyle  . Physical activity:    Days per week: Not on file    Minutes per session: Not on file  . Stress: Not on file  Relationships  . Social connections:    Talks on phone: Not on file    Gets together: Not on file    Attends religious service: Not on file    Active member of club or organization: Not on file    Attends meetings of clubs or organizations: Not on file    Relationship status: Not on file  . Intimate partner violence:    Fear of current or ex partner: Not on file    Emotionally abused: Not on file    Physically abused: Not on file    Forced sexual activity: Not on file  Other Topics Concern  . Not on file  Social History  Narrative  . Not on file        Objective:   Physical Exam  General: AAO x3, NAD  Dermatological: Skin is warm, dry and supple bilateral. Nails x 10 are well manicured; remaining integument appears unremarkable at this time. There are no open sores, no preulcerative lesions, no rash or signs of infection present.  Vascular: Dorsalis Pedis artery and Posterior Tibial artery pedal pulses are 2/4 bilateral with immedate capillary fill time. Pedal hair growth present. No varicosities and no lower extremity edema present bilateral. There is no pain with calf compression, swelling, warmth, erythema.   Neruologic: Grossly intact via light touch bilateral.  Protective threshold with Semmes Wienstein monofilament intact to all pedal sites bilateral.  Negative Tinel sign.  Musculoskeletal: Tenderness to palpation along the plantar medial tubercle of the calcaneus at the insertion of plantar fascia on the left foot. There is no pain along the course of the plantar fascia within the arch of the foot. Plantar fascia appears to be intact. There is no pain with lateral compression of the calcaneus or pain with vibratory sensation. There is no pain along the course or insertion of the achilles tendon. No other areas of tenderness to bilateral lower extremities. Muscular strength 5/5 in all groups tested bilateral.  Gait: Unassisted, Nonantalgic.      Assessment & Plan:  57 year old male left heel pain, plantar fasciitis- mildly on the right -Treatment options discussed including all alternatives, risks, and complications -Etiology of symptoms were discussed -X-rays were obtained and reviewed with the patient. No evidence of acute fracture or stress fracture identified today. -Plantar fascial injection was performed on the left.  See procedure note below. -Plantar fascial brace dispensed bilaterally -Prescribed mobic. Discussed side effects of the medication and directed to stop if any are to occur and  call the office.  -Stretching and icing daily -Discussed orthotics and to continue with supportive shoes.  -RTC 3 weeks or sooner if needed  Vivi Barrack DPM

## 2017-10-31 NOTE — Patient Instructions (Signed)

## 2017-11-02 DIAGNOSIS — M722 Plantar fascial fibromatosis: Secondary | ICD-10-CM | POA: Insufficient documentation

## 2017-11-21 ENCOUNTER — Encounter: Payer: Self-pay | Admitting: Podiatry

## 2017-11-21 ENCOUNTER — Ambulatory Visit (INDEPENDENT_AMBULATORY_CARE_PROVIDER_SITE_OTHER): Payer: 59 | Admitting: Podiatry

## 2017-11-21 DIAGNOSIS — M722 Plantar fascial fibromatosis: Secondary | ICD-10-CM

## 2017-11-21 MED ORDER — TRIAMCINOLONE ACETONIDE 10 MG/ML IJ SUSP
10.0000 mg | Freq: Once | INTRAMUSCULAR | Status: AC
  Administered 2017-11-21: 10 mg

## 2017-11-23 NOTE — Progress Notes (Signed)
Subjective: 57 year old male presents the office today for follow-up evaluation of left heel pain, plantar fasciitis.  He states he was doing much better up until last couple days and start to get recurrence.  Denies any recent injury or trauma and overall he is doing well.  He has no other concerns today. Denies any systemic complaints such as fevers, chills, nausea, vomiting. No acute changes since last appointment, and no other complaints at this time.   Objective: AAO x3, NAD DP/PT pulses palpable bilaterally, CRT less than 3 seconds There is mild tenderness palpation so in the plantar medial tubercle of the calcaneus at the insertion of plantar fascia on the left foot.  There is no known course of the plantar fascia the arch of the foot.  There is no pain with lateral compression of the calcaneus.  There is no overlying edema, erythema, increase in warmth.  There is no other areas of tenderness. No open lesions or pre-ulcerative lesions.  No pain with calf compression, swelling, warmth, erythema  Assessment: Left foot plantar fasciitis  Plan: -All treatment options discussed with the patient including all alternatives, risks, complications.  -Steroid injection was performed.  This is the second injection.  See procedure note below. -He was molded for custom orthotics today.  Check insurance coverage before ordering.  Information was given to [employer] and Dawn.  -Continue stretching, icing exercises daily.  Anti-inflammatories as needed. -Patient encouraged to call the office with any questions, concerns, change in symptoms.   Procedure: Injection Tendon/Ligament Discussed alternatives, risks, complications and verbal consent was obtained.  Location: LEFT plantar fascia at the glabrous junction; medial approach. Skin Prep: Alcohol. Injectate: 0.5cc 0.5% marcaine plain, 0.5 cc 2% lidocaine plain and, 1 cc kenalog 10. Disposition: Patient tolerated procedure well. Injection site dressed with a  band-aid.  Post-injection care was discussed and return precautions discussed.   Return in about 1 month (around 12/19/2017).  Vivi BarrackMatthew R Yuliya Nova DPM

## 2017-11-24 ENCOUNTER — Telehealth: Payer: Self-pay | Admitting: Podiatry

## 2017-11-24 NOTE — Telephone Encounter (Signed)
Called pt to let him know orthotics are covered at 80% after deductible and pt has met this. Pt aware and wants to proceed with ordering the orthotics.

## 2017-11-24 NOTE — Telephone Encounter (Signed)
Thanks. Can you please enter the charges?

## 2017-12-08 ENCOUNTER — Ambulatory Visit (INDEPENDENT_AMBULATORY_CARE_PROVIDER_SITE_OTHER): Payer: 59 | Admitting: Orthotics

## 2017-12-08 DIAGNOSIS — M79672 Pain in left foot: Secondary | ICD-10-CM

## 2017-12-08 DIAGNOSIS — M79671 Pain in right foot: Secondary | ICD-10-CM

## 2017-12-08 DIAGNOSIS — M722 Plantar fascial fibromatosis: Secondary | ICD-10-CM

## 2017-12-08 NOTE — Progress Notes (Signed)
Patient came in today to pick up custom made foot orthotics.  The goals were accomplished and the patient reported no dissatisfaction with said orthotics.  Patient was advised of breakin period and how to report any issues. 

## 2017-12-26 ENCOUNTER — Ambulatory Visit: Payer: 59 | Admitting: Podiatry

## 2017-12-26 ENCOUNTER — Encounter: Payer: 59 | Admitting: Orthotics

## 2017-12-26 DIAGNOSIS — M722 Plantar fascial fibromatosis: Secondary | ICD-10-CM | POA: Diagnosis not present

## 2017-12-27 NOTE — Progress Notes (Signed)
Subjective: 57 year old male presents the office today for follow-up evaluation of left heel pain, plantar fasciitis.  He states he is doing significantly better.  He still having some occasional discomfort.  He has been wearing orthotics and getting used to them however he feels that they are hitting on the inside part of the heel rolling him outwards.  He is able to do more activity without pain. Denies any systemic complaints such as fevers, chills, nausea, vomiting. No acute changes since last appointment, and no other complaints at this time.   Objective: AAO x3, NAD DP/PT pulses palpable bilaterally, CRT less than 3 seconds There is minimal tenderness palpation so in the plantar medial tubercle of the calcaneus at the insertion of plantar fascia on the left foot.  There is no pain along the course of the plantar fascia the arch of the foot.  There is no pain with lateral compression of the calcaneus.  There is no overlying edema, erythema, increase in warmth.  There is no other areas of tenderness. No open lesions or pre-ulcerative lesions.  No pain with calf compression, swelling, warmth, erythema  Assessment: Left foot plantar fasciitis  Plan: -All treatment options discussed with the patient including all alternatives, risks, complications.  -Discussed the third steroid injection however he wishes to hold off on those he feels he does not need it.  Plan continue with stretching, icing exercises daily.  I did evaluate his orthotics and feels that the medial heel was rolling out.  We will have his remade to move the apex more distal.  In the meantime I am to give him a pair of power steps to try. -If symptoms continue discussed other treatments including physical therapy, EPAT.   Vivi BarrackMatthew R Keerstin Bjelland DPM

## 2018-01-09 ENCOUNTER — Encounter: Payer: 59 | Admitting: Orthotics

## 2018-01-12 ENCOUNTER — Ambulatory Visit (INDEPENDENT_AMBULATORY_CARE_PROVIDER_SITE_OTHER): Payer: 59 | Admitting: Orthotics

## 2018-01-12 DIAGNOSIS — M722 Plantar fascial fibromatosis: Secondary | ICD-10-CM

## 2018-01-12 DIAGNOSIS — M79672 Pain in left foot: Secondary | ICD-10-CM

## 2018-01-12 DIAGNOSIS — M79671 Pain in right foot: Secondary | ICD-10-CM

## 2018-01-18 NOTE — Progress Notes (Signed)
P/up correced f/o.Marland Kitchen.Marland Kitchen.pleased.

## 2018-03-27 ENCOUNTER — Ambulatory Visit (INDEPENDENT_AMBULATORY_CARE_PROVIDER_SITE_OTHER): Payer: 59 | Admitting: Sports Medicine

## 2018-03-27 DIAGNOSIS — M25579 Pain in unspecified ankle and joints of unspecified foot: Secondary | ICD-10-CM | POA: Diagnosis not present

## 2018-03-27 NOTE — Assessment & Plan Note (Signed)
Bilateral midfoot pain most likely cause by compression with wearing of his orthotics - Patient will purchase new shoes with removable insoles and try his orthotics in the new shoes to see if symptoms persist. If they do then we will offer him a new pair or adding a metatarsal pad to help relieve his symptoms - Patient will also try lacing up his shoes differently to increase width of his shoe to help decrease compression on his foot - Patient given information on [place] sock to help with plantar fascitis - Follow up as needed if pain does not improve or worsens after making shoes changes

## 2018-03-27 NOTE — Progress Notes (Signed)
Subjective:  HPI: Gary Acevedo is a 57 y.o. presenting to clinic today to discuss the following:  Bilateral Foot Pain Patient with PMH of bilateral plantar fascitis presents today for bilateral foot pain, more on the left than the right. Patient has been seen and treated by [facility] for plantar fascitis and at his last visit was given a pair of modified orthotics that when he wears them have relieved his heel pain. However, when he wears them consistently he has now developed pain on the top and bottom of his midfoot that is described as "burning" and radiates into his toes. The pain occurs everyday only when wearing his shoes with his orthotics and it occurs while standing or sitting. It is relieve by not wearing his orthotics but then his heel pain returns. Of note, patient is wearing his orthotics in shoes that do not have a removable insole.  ROS noted in HPI.   Past Medical, Surgical, Social, and Family History Reviewed & Updated per EMR.   Pertinent Historical Findings include:   Social History   Tobacco Use  Smoking Status Never Smoker  Smokeless Tobacco Never Used   Objective: BP (!) 142/86   Ht 5\' 9"  (1.753 m)   Wt 175 lb (79.4 kg)   BMI 25.84 kg/m  Vitals and nursing notes reviewed  Physical Exam Gen: Alert and Oriented x 3, NAD HEENT: Normocephalic, atraumatic MSK: Moves all four extremities; No swelling or skin discoloration, TTP in the left midfoot and at the tarsal heads, anterior drawer test for ankle negative bilaterally, no ligament or laxity in talar tilt bilaterally, [place] ankle score 0 bilaterally. Ext: no clubbing, cyanosis, or edema; +2 posterior tibialis and dorsalis pedis pulses Neuro: No gross deficits, gross sensation intact in LE bilaterally, 5/5 strength bilaterally in LE Skin: warm, dry, intact, no rashes  No results found for this or any previous visit (from the past 72 hour(s)).  Assessment/Plan:  Pain in joint, ankle and  foot Bilateral midfoot pain most likely cause by compression with wearing of his orthotics - Patient will purchase new shoes with removable insoles and try his orthotics in the new shoes to see if symptoms persist. If they do then we will offer him a new pair or adding a metatarsal pad to help relieve his symptoms - Patient will also try lacing up his shoes differently to increase width of his shoe to help decrease compression on his foot - Patient given information on [place] sock to help with plantar fascitis - Follow up as needed if pain does not improve or worsens after making shoes changes   PATIENT EDUCATION PROVIDED: See AVS    Diagnosis and plan along with any newly prescribed medication(s) were discussed in detail with this patient today. The patient verbalized understanding and agreed with the plan. Patient advised if symptoms worsen return to clinic or ER.   Jules Schick, DO 03/27/2018, 5:24 PM PGY-2 [facility]  Patient seen and evaluated with the resident.  I agree with the above plan of care.  Patient's symptoms are specific to his custom orthotics which were created by podiatry.  I recommended that he try loosening the laces in his shoe and buying a pair of work shoes that have a removable insert.  If these are unsuccessful then he may return to the office for consideration of a new custom orthotic.  The good news is his current orthotics have alleviated his plantar fascial pain and  he also has a pair of tennis shoes (which he does not wear the orthotic in) which are very comfortable as well.

## 2018-03-28 ENCOUNTER — Encounter: Payer: Self-pay | Admitting: Sports Medicine

## 2019-05-16 ENCOUNTER — Other Ambulatory Visit: Payer: Self-pay | Admitting: Urology

## 2019-05-16 MED ORDER — MIRABEGRON ER 50 MG PO TB24: 50.0000 mg | ORAL_TABLET | Freq: Every day | ORAL | 0 refills | Status: DC

## 2019-05-16 NOTE — Progress Notes (Signed)
Rx Myrbetriq 50mg  qd for bladder spasms.  Declined Oxybuytnin

## 2021-04-23 ENCOUNTER — Other Ambulatory Visit: Payer: Self-pay | Admitting: Internal Medicine

## 2021-04-23 DIAGNOSIS — E7800 Pure hypercholesterolemia, unspecified: Secondary | ICD-10-CM

## 2021-07-26 DIAGNOSIS — R001 Bradycardia, unspecified: Secondary | ICD-10-CM | POA: Insufficient documentation

## 2021-07-26 DIAGNOSIS — I471 Supraventricular tachycardia: Secondary | ICD-10-CM | POA: Insufficient documentation

## 2021-07-26 NOTE — Progress Notes (Signed)
Cardiology Office Note   Date:  07/27/2021   ID:  Gary Acevedo, DOB 02-Aug-1960, MRN [id number]  PCP:  Merri Brunette, MD  Cardiologist:   None Referring:  Merri Brunette, MD  Chief Complaint  Patient presents with   Bradycardia      History of Present Illness: Gary Acevedo is a 61 y.o. male who presents for evaluation of bradycardia and SVT.    He was referred by Merri Brunette, MD.  He is complaining of dizziness.  I did review primary care records.  He may have ordered a ZIO although this did not come with his primary care records so I have not seen this result.  I did review electrolytes from late last year and these were normal.  TSH was normal.  Of note I also see a coronary calcium score was 41.  This was done in November 2022.  He said that he had a monitor placed which was a 2-week device because he was dizzy.  His meds have been switched from amlodipine to olmesartan and this may have contributed.  He said the monitor showed that he had heart rates in the 40s and 50s with activity and in the 30s at rest.  However, he has never had any presyncope or syncope.  He denies any chest pressure, neck or arm discomfort.  Has not had any new palpitations, presyncope or syncope.  He has never had any prior cardiac history.  He does push-ups and some light weights though he does not do a lot of aerobic activity routinely.  He walks his dog twice a day.  Echo in 2010 demonstrated normal EF and no other abnormalities.   Event monitor at that time demonstrated no arrhythmias.    Past Medical History:  Diagnosis Date   Anxiety    Dizziness    Dyslipidemia    HIV (human immunodeficiency virus infection) (HCC)    Hypercholesteremia    Hypertension    Near syncope    OSA (obstructive sleep apnea)    Palpitations     Past Surgical History:  Procedure Laterality Date   TONSILLECTOMY  2004   UVULOPALATOPHARYNGOPLASTY  2004     Current Outpatient Medications  Medication Sig Dispense  Refill   amLODipine (NORVASC) 2.5 MG tablet 1 tablet     atorvastatin (LIPITOR) 10 MG tablet Take 5 mg by mouth daily.     citalopram (CELEXA) 20 MG tablet      dolutegravir-lamiVUDine (DOVATO) 50-300 MG tablet 1 tablet     Multiple Vitamin (MULTI-VITAMIN) tablet Take 1 tablet by mouth daily.     Omega-3 1000 MG CAPS Take by mouth.     No current facility-administered medications for this visit.    Allergies:   Amoxicillin, Sulfa antibiotics, and Sulfa drugs cross reactors    Social History:  The patient  reports that he has never smoked. He has never used smokeless tobacco. He reports current alcohol use. He reports that he does not use drugs.   Family History:  The patient's family history includes Alzheimer's disease in his mother; COPD in his mother; Hyperlipidemia in his father; Hypertension in his father and mother.    ROS:  Please see the history of present illness.   Otherwise, review of systems are positive for none.   All other systems are reviewed and negative.    PHYSICAL EXAM: VS:  BP 123/69    Pulse (!) 49    Ht 5\' 10"  (1.778 m)  Wt 169 lb (76.7 kg)    SpO2 98%    BMI 24.25 kg/m  , BMI Body mass index is 24.25 kg/m. GENERAL:  Well appearing HEENT:  Pupils equal round and reactive, fundi not visualized, oral mucosa unremarkable NECK:  No jugular venous distention, waveform within normal limits, carotid upstroke brisk and symmetric, no bruits, no thyromegaly LYMPHATICS:  No cervical, inguinal adenopathy LUNGS:  Clear to auscultation bilaterally BACK:  No CVA tenderness CHEST:  Unremarkable HEART:  PMI not displaced or sustained,S1 and S2 within normal limits, no S3, no S4, no clicks, no rubs, no murmurs ABD:  Flat, positive bowel sounds normal in frequency in pitch, no bruits, no rebound, no guarding, no midline pulsatile mass, no hepatomegaly, no splenomegaly EXT:  2 plus pulses throughout, no edema, no cyanosis no clubbing SKIN:  No rashes no nodules NEURO:   Cranial nerves II through XII grossly intact, motor grossly intact throughout PSYCH:  Cognitively intact, oriented to person place and time    EKG:  EKG is ordered today. The ekg ordered today demonstrates sinus bradycardia, rate 49, axis within normal limits, intervals within normal limits, no acute ST or T wave changes.   Recent Labs: No results found for requested labs within last 8760 hours.    Lipid Panel No results found for: CHOL, TRIG, HDL, CHOLHDL, VLDL, LDLCALC, LDLDIRECT    Wt Readings from Last 3 Encounters:  07/27/21 169 lb (76.7 kg)  03/27/18 175 lb (79.4 kg)  05/19/16 176 lb (79.8 kg)      Other studies Reviewed: Additional studies/ records that were reviewed today include: Office records and labs. Review of the above records demonstrates:  Please see elsewhere in the note.     ASSESSMENT AND PLAN:  SVT: I will review the monitor when the device is available.  I suspect some short nonsustained SVT and he is not really feeling this.  No change in therapy would be needed.  BRADYCARDIA: I doubt chronotropic incompetence.  He has no symptoms consistent with symptomatic bradycardia arrhythmias.  He can keep an eye on his heart rate when he is physically active and I have suggested an increase aerobic exercise regimen.  If he finds any limitations related to this he would let me know and I probably would do further testing but at this point I am not suspecting chronotropic incompetence.  DYSLIPIDEMIA: LDL in December was 73.  Total cholesterol 127, HDL 44.  No change in therapy.  ELEVATED CORONARY CALCIUM: We had a long discussion about this.  He has no symptoms.  He is pursuing aggressive risk reduction.  No change in therapy.  Current medicines are reviewed at length with the patient today.  The patient does not have concerns regarding medicines.  The following changes have been made:  no change  Labs/ tests ordered today include:   Orders Placed This Encounter   Procedures   EKG 12-Lead     Disposition:   FU with me as needed.   Signed, Rollene Rotunda, MD  07/27/2021 10:27 AM    [facility]

## 2021-07-27 ENCOUNTER — Other Ambulatory Visit: Payer: Self-pay

## 2021-07-27 ENCOUNTER — Ambulatory Visit (INDEPENDENT_AMBULATORY_CARE_PROVIDER_SITE_OTHER): Payer: 59 | Admitting: Cardiology

## 2021-07-27 ENCOUNTER — Encounter: Payer: Self-pay | Admitting: Cardiology

## 2021-07-27 VITALS — BP 123/69 | HR 49 | Ht 70.0 in | Wt 169.0 lb

## 2021-07-27 DIAGNOSIS — I471 Supraventricular tachycardia: Secondary | ICD-10-CM

## 2021-07-27 DIAGNOSIS — R001 Bradycardia, unspecified: Secondary | ICD-10-CM

## 2021-07-27 NOTE — Patient Instructions (Signed)
Medication Instructions:  Your physician recommends that you continue on your current medications as directed. Please refer to the Current Medication list given to you today.  *If you need a refill on your cardiac medications before your next appointment, please call your pharmacy*   Follow-Up: At [facility], you and your health needs are our priority.  As part of our continuing mission to provide you with exceptional heart care, we have created designated Provider Care Teams.  These Care Teams include your primary Cardiologist (physician) and Advanced Practice Providers (APPs -  Physician Assistants and Nurse Practitioners) who all work together to provide you with the care you need, when you need it.  We recommend signing up for the patient portal called "MyChart".  Sign up information is provided on this After Visit Summary.  MyChart is used to connect with patients for Virtual Visits (Telemedicine).  Patients are able to view lab/test results, encounter notes, upcoming appointments, etc.  Non-urgent messages can be sent to your provider as well.   To learn more about what you can do with MyChart, go to [email or web address].    Your next appointment:   We will see you on an as needed basis.  Provider:   James Hochrein, MD 

## 2021-08-21 ENCOUNTER — Other Ambulatory Visit: Payer: Self-pay | Admitting: Internal Medicine

## 2021-08-21 DIAGNOSIS — R7989 Other specified abnormal findings of blood chemistry: Secondary | ICD-10-CM

## 2021-08-31 ENCOUNTER — Other Ambulatory Visit: Payer: 59

## 2021-09-10 ENCOUNTER — Other Ambulatory Visit: Payer: Self-pay

## 2021-09-10 ENCOUNTER — Ambulatory Visit
Admission: RE | Admit: 2021-09-10 | Discharge: 2021-09-10 | Disposition: A | Discharge status: Home / Self Care | Payer: 59 | Source: Ambulatory Visit | Attending: Internal Medicine | Admitting: Internal Medicine

## 2021-09-10 DIAGNOSIS — R7989 Other specified abnormal findings of blood chemistry: Secondary | ICD-10-CM

## 2022-01-02 ENCOUNTER — Encounter (HOSPITAL_COMMUNITY): Payer: Self-pay

## 2022-01-02 ENCOUNTER — Emergency Department (HOSPITAL_COMMUNITY)
Admission: EM | Admit: 2022-01-02 | Discharge: 2022-01-02 | Disposition: A | Discharge status: Home / Self Care | Payer: 59 | Attending: Emergency Medicine | Admitting: Emergency Medicine

## 2022-01-02 ENCOUNTER — Other Ambulatory Visit: Payer: Self-pay

## 2022-01-02 ENCOUNTER — Emergency Department (HOSPITAL_COMMUNITY): Payer: 59

## 2022-01-02 DIAGNOSIS — I10 Essential (primary) hypertension: Secondary | ICD-10-CM | POA: Insufficient documentation

## 2022-01-02 DIAGNOSIS — Z79899 Other long term (current) drug therapy: Secondary | ICD-10-CM | POA: Insufficient documentation

## 2022-01-02 DIAGNOSIS — Y93K1 Activity, walking an animal: Secondary | ICD-10-CM | POA: Insufficient documentation

## 2022-01-02 DIAGNOSIS — Z21 Asymptomatic human immunodeficiency virus [HIV] infection status: Secondary | ICD-10-CM | POA: Diagnosis not present

## 2022-01-02 DIAGNOSIS — S99922A Unspecified injury of left foot, initial encounter: Secondary | ICD-10-CM | POA: Diagnosis present

## 2022-01-02 DIAGNOSIS — X501XXA Overexertion from prolonged static or awkward postures, initial encounter: Secondary | ICD-10-CM | POA: Diagnosis not present

## 2022-01-02 MED ORDER — IBUPROFEN 800 MG PO TABS
800.0000 mg | ORAL_TABLET | Freq: Once | ORAL | Status: AC
  Administered 2022-01-02: 800 mg via ORAL
  Filled 2022-01-02: qty 1

## 2022-01-02 NOTE — ED Notes (Signed)
Ace wrapped placed to L foot

## 2022-01-02 NOTE — ED Triage Notes (Signed)
Patient reports that he was walking his dog this AM and stepped in a hole. Patient states he twisted the left foot and is now having left lateral foot pain.

## 2022-01-02 NOTE — ED Provider Notes (Signed)
[facility]-EMERGENCY DEPT Provider Note   CSN: [id number] Arrival date & time: 01/02/22  1745     History  Chief Complaint  Patient presents with   Foot Injury    Gary Acevedo is a 61 y.o. male with medical history significant for hypertension, HIV, anxiety.  The patient presents to ED for evaluation of left ankle injury.  Patient states that prior to arrival he was walking his dog when he stepped in a hole causing his left ankle to roll laterally.  The patient states he had immediate pain at this time.  Patient denies any over-the-counter medication prior to arrival.  Patient denies any numbness or tingling into the left foot.   Foot Injury      Home Medications Prior to Admission medications   Medication Sig Start Date End Date Taking? Authorizing Provider  amLODipine (NORVASC) 2.5 MG tablet 1 tablet 07/21/21   [provider]  atorvastatin (LIPITOR) 10 MG tablet Take 5 mg by mouth daily. 07/21/21   [provider]  citalopram (CELEXA) 20 MG tablet  07/30/10   [provider]  dolutegravir-lamiVUDine (DOVATO) 50-300 MG tablet 1 tablet 01/30/21   [provider]  Multiple Vitamin (MULTI-VITAMIN) tablet Take 1 tablet by mouth daily.    [provider]  Omega-3 1000 MG CAPS Take by mouth.    [provider]      Allergies    Amoxicillin, Sulfa antibiotics, and Sulfa drugs cross reactors    Review of Systems   Review of Systems  Musculoskeletal:  Positive for arthralgias and myalgias. Negative for gait problem.  All other systems reviewed and are negative.   Physical Exam Updated Vital Signs BP 133/76 (BP Location: Right Arm)   Pulse (!) 49   Temp 98 F (36.7 C) (Oral)   Resp 15   Ht 5\' 10"  (1.778 m)   Wt 77.1 kg   SpO2 98%   BMI 24.39 kg/m  Physical Exam Vitals and nursing note reviewed.  Constitutional:      General: He is not in acute distress.    Appearance: Normal appearance. He is  not ill-appearing, toxic-appearing or diaphoretic.  HENT:     Head: Normocephalic and atraumatic.     Nose: Nose normal. No congestion.     Mouth/Throat:     Mouth: Mucous membranes are moist.     Pharynx: Oropharynx is clear.  Eyes:     Extraocular Movements: Extraocular movements intact.     Conjunctiva/sclera: Conjunctivae normal.     Pupils: Pupils are equal, round, and reactive to light.  Cardiovascular:     Rate and Rhythm: Normal rate and regular rhythm.     Pulses:          Dorsalis pedis pulses are 2+ on the right side and 2+ on the left side.  Pulmonary:     Effort: Pulmonary effort is normal.     Breath sounds: Normal breath sounds. No wheezing.  Abdominal:     General: Abdomen is flat. Bowel sounds are normal.     Palpations: Abdomen is soft.     Tenderness: There is no abdominal tenderness.  Musculoskeletal:     Cervical back: Normal range of motion and neck supple. No tenderness.     Right ankle: Normal.     Left ankle: Swelling present. No deformity, ecchymosis or lacerations. Tenderness present. Normal range of motion.     Comments: Patient left ankle without findings of deformity, ecchymosis.  The patient  has intact range of motion both actively and passively.  There is tenderness to the lateral part of the patient left foot however there is no overlying skin change.  Skin:    General: Skin is warm and dry.     Capillary Refill: Capillary refill takes less than 2 seconds.  Neurological:     Mental Status: He is alert and oriented to person, place, and time.     ED Results / Procedures / Treatments   Labs (all labs ordered are listed, but only abnormal results are displayed) Labs Reviewed - No data to display  EKG None  Radiology DG Foot Complete Left  Result Date: 01/02/2022 CLINICAL DATA:  Stepped in hole and twisted left foot. Left foot pain laterally. EXAM: LEFT FOOT - COMPLETE 3+ VIEW COMPARISON:  10/31/2017 FINDINGS: There is no evidence of fracture  or dislocation. There is no evidence of arthropathy or other focal bone abnormality. Small plantar and dorsal calcaneal bone spurs again noted. IMPRESSION: No acute findings. Electronically Signed   By: Danae Orleans M.D.   On: 01/02/2022 18:47    Procedures Procedures   Medications Ordered in ED Medications  ibuprofen (ADVIL) tablet 800 mg (800 mg Oral Given 01/02/22 1845)    ED Course/ Medical Decision Making/ A&P                           Medical Decision Making Amount and/or Complexity of Data Reviewed Radiology: ordered.  Risk Prescription drug management.   61 year old male presents to the ED for evaluation.  Please see HPI for further details.  On examination, the patient's left ankle has no findings of deformity or ecchymosis.  The patient does have intact range of motion both actively and passively.  There is tenderness to the lateral part of the patient left foot however there is no overlying skin change.  The patient denies ankle pain however does endorse foot pain.  Patient plan from imaging of left foot shows no fracture, deformity.  The patient will be placed in an Ace wrap and advised to treat symptomatically at home with ice, ibuprofen/Tylenol and rest.  Patient advised if symptoms continue after 7 days to follow-up with PCP. I have counseled the patient on the RICE method and he has voiced understanding.  Patient had all of his questions answered prior to discharge.  The patient was given return return precautions and he voiced understanding.  The patient is stable this time for discharge home.  Final Clinical Impression(s) / ED Diagnoses Final diagnoses:  Injury of left foot, initial encounter    Rx / DC Orders ED Discharge Orders     None         Clent Ridges 01/02/22 1919    Wynetta Fines, MD 01/02/22 2308

## 2022-01-02 NOTE — Discharge Instructions (Addendum)
Please return to the ED with any new or worsening signs or symptoms Please follow-up with your PCP for any ongoing needs if pain persists after 5 to 7 days Please continue to use ibuprofen/Tylenol at home as well as ice packs Please utilize RICE method.  Rest, ice, compression, elevation.

## 2022-06-05 LAB — COLOGUARD: COLOGUARD: NEGATIVE

## 2024-03-09 ENCOUNTER — Other Ambulatory Visit: Payer: Self-pay

## 2024-03-09 ENCOUNTER — Emergency Department (HOSPITAL_COMMUNITY)

## 2024-03-09 ENCOUNTER — Emergency Department (HOSPITAL_COMMUNITY)
Admission: EM | Admit: 2024-03-09 | Discharge: 2024-03-09 | Disposition: A | Discharge status: Home / Self Care | Attending: Emergency Medicine | Admitting: Emergency Medicine

## 2024-03-09 DIAGNOSIS — R001 Bradycardia, unspecified: Secondary | ICD-10-CM | POA: Insufficient documentation

## 2024-03-09 DIAGNOSIS — Z21 Asymptomatic human immunodeficiency virus [HIV] infection status: Secondary | ICD-10-CM | POA: Diagnosis not present

## 2024-03-09 DIAGNOSIS — Z79899 Other long term (current) drug therapy: Secondary | ICD-10-CM | POA: Diagnosis not present

## 2024-03-09 DIAGNOSIS — Z8551 Personal history of malignant neoplasm of bladder: Secondary | ICD-10-CM | POA: Insufficient documentation

## 2024-03-09 DIAGNOSIS — R079 Chest pain, unspecified: Secondary | ICD-10-CM | POA: Diagnosis present

## 2024-03-09 DIAGNOSIS — I10 Essential (primary) hypertension: Secondary | ICD-10-CM | POA: Insufficient documentation

## 2024-03-09 DIAGNOSIS — R0789 Other chest pain: Secondary | ICD-10-CM | POA: Insufficient documentation

## 2024-03-09 LAB — CBC
HCT: 48 % (ref 39.0–52.0)
Hemoglobin: 16.4 g/dL (ref 13.0–17.0)
MCH: 32.7 pg (ref 26.0–34.0)
MCHC: 34.2 g/dL (ref 30.0–36.0)
MCV: 95.6 fL (ref 80.0–100.0)
Platelets: 158 K/uL (ref 150–400)
RBC: 5.02 MIL/uL (ref 4.22–5.81)
RDW: 12.8 % (ref 11.5–15.5)
WBC: 3.4 K/uL — ABNORMAL LOW (ref 4.0–10.5)
nRBC: 0 % (ref 0.0–0.2)

## 2024-03-09 LAB — BASIC METABOLIC PANEL WITH GFR
Anion gap: 11 (ref 5–15)
BUN: 17 mg/dL (ref 8–23)
CO2: 25 mmol/L (ref 22–32)
Calcium: 9.1 mg/dL (ref 8.9–10.3)
Chloride: 105 mmol/L (ref 98–111)
Creatinine, Ser: 0.82 mg/dL (ref 0.61–1.24)
GFR, Estimated: >60 mL/min (ref >60)
Glucose, Bld: 97 mg/dL (ref 70–99)
Potassium: 4.3 mmol/L (ref 3.5–5.1)
Sodium: 141 mmol/L (ref 135–145)

## 2024-03-09 LAB — TROPONIN T, HIGH SENSITIVITY
Troponin T High Sensitivity: 17 ng/L (ref 0–19)
Troponin T High Sensitivity: 16 ng/L (ref 0–19)

## 2024-03-09 LAB — D-DIMER, QUANTITATIVE: D-Dimer, Quant: <0.27 ug{FEU}/mL (ref 0.00–0.50)

## 2024-03-09 NOTE — ED Triage Notes (Signed)
 Patient in today reporting left sided chest pain radiating down left arm x3 intermittent.

## 2024-03-09 NOTE — ED Provider Notes (Signed)
 [place] EMERGENCY DEPARTMENT AT [facility] Provider Note   CSN: [id number] Arrival date & time: 03/09/24  1111     Patient presents with: Chest Pain   Gary Acevedo is a 63 y.o. male.   Pt is a 63 yo male with pmhx significant for htn, bladder cancer (treated), hld, chronic bradycardia, and HIV.  Pt comes in today with 3 episodes of cp.  He said it was brief and gone now.  He's never had anything like it in the past.  No sob.  1 time, the pain went down his left arm.  No n/v.  Pt was working at his computer when sx started.        Prior to Admission medications   Medication Sig Start Date End Date Taking? Authorizing Provider  amLODipine (NORVASC) 2.5 MG tablet 1 tablet 07/21/21   [provider]  atorvastatin (LIPITOR) 10 MG tablet Take 5 mg by mouth daily. 07/21/21   [provider]  citalopram (CELEXA) 20 MG tablet  07/30/10   [provider]  dolutegravir-lamiVUDine (DOVATO) 50-300 MG tablet 1 tablet 01/30/21   [provider]  Multiple Vitamin (MULTI-VITAMIN) tablet Take 1 tablet by mouth daily.    [provider]  Omega-3 1000 MG CAPS Take by mouth.    [provider]    Allergies: Amoxicillin, Sulfa antibiotics, and Sulfa drugs cross reactors    Review of Systems  Cardiovascular:  Positive for chest pain.  All other systems reviewed and are negative.   Updated Vital Signs BP (!) 140/84   Pulse (!) 48   Temp 98 F (36.7 C) (Oral)   Resp 14   SpO2 100%   Physical Exam Vitals and nursing note reviewed.  Constitutional:      Appearance: He is well-developed.  HENT:     Head: Normocephalic and atraumatic.  Eyes:     Extraocular Movements: Extraocular movements intact.     Pupils: Pupils are equal, round, and reactive to light.  Cardiovascular:     Rate and Rhythm: Regular rhythm. Bradycardia present.     Heart sounds: Normal heart sounds.  Pulmonary:     Effort: Pulmonary effort is normal.      Breath sounds: Normal breath sounds.  Abdominal:     General: Bowel sounds are normal.     Palpations: Abdomen is soft.  Musculoskeletal:        General: Normal range of motion.     Cervical back: Normal range of motion and neck supple.  Skin:    General: Skin is warm.     Capillary Refill: Capillary refill takes less than 2 seconds.  Neurological:     General: No focal deficit present.     Mental Status: He is alert and oriented to person, place, and time.  Psychiatric:        Mood and Affect: Mood normal.        Behavior: Behavior normal.     (all labs ordered are listed, but only abnormal results are displayed) Labs Reviewed  CBC - Abnormal; Notable for the following components:      Result Value   WBC 3.4 (*)    All other components within normal limits  BASIC METABOLIC PANEL WITH GFR  D-DIMER, QUANTITATIVE  TROPONIN T, HIGH SENSITIVITY  TROPONIN T, HIGH SENSITIVITY    EKG: EKG Interpretation Date/Time:  Friday March 09 2024 11:28:05 EDT Ventricular Rate:  48 PR Interval:  197 QRS Duration:  107 QT Interval:  439 QTC Calculation: 393 R Axis:   36  Text Interpretation: Sinus bradycardia Anterior infarct, old Since last tracing rate slower Confirmed by Dean Clarity [phone]) on 03/09/2024 1:59:50 PM  Radiology: [facility] Chest 2 View Result Date: 03/09/2024 EXAM: 2 VIEW(S) XRAY OF THE CHEST 03/09/2024 11:59:00 AM COMPARISON: None available. CLINICAL HISTORY: Chest pain. Pt states this morning he has two experiences 10 sec apart from each other. He states the sharp pain started in his chest and radiated down his left arm. Pt stated he had a third experience later this morning, but the pain was only in his chest. FINDINGS: LUNGS AND PLEURA: No focal pulmonary opacity. No pulmonary edema. No pleural effusion. No pneumothorax. HEART AND MEDIASTINUM: No acute abnormality of the cardiac and mediastinal silhouettes. BONES AND SOFT TISSUES: No acute osseous abnormality.  IMPRESSION: 1. Normal chest radiograph. No acute cardiopulmonary process detected. Electronically signed by: Ryan Chess MD 03/09/2024 12:30 PM EDT RP Workstation: [id number]     Procedures   Medications Ordered in the ED - No data to display                                  Medical Decision Making Amount and/or Complexity of Data Reviewed Labs: ordered. Radiology: ordered.   This patient presents to the ED for concern of cp, this involves an extensive number of treatment options, and is a complaint that carries with it a high risk of complications and morbidity.  The differential diagnosis includes cardiac, pulm, gi   Co morbidities that complicate the patient evaluation  htn, hld, chronic bradycardia, and HIV   Additional history obtained:  Additional history obtained from epic chart review  Lab Tests:  I Ordered, and personally interpreted labs.  The pertinent results include:  cbc nl, bmp nl, trop neg, ddimer neg   Imaging Studies ordered:  I ordered imaging studies including cxr  I independently visualized and interpreted imaging which showed Normal chest radiograph. No acute cardiopulmonary process detected.  I agree with the radiologist interpretation   Cardiac Monitoring:  The patient was maintained on a cardiac monitor.  I personally viewed and interpreted the cardiac monitored which showed an underlying rhythm of: bradycardia   Medicines ordered and prescription drug management:   I have reviewed the patients home medicines and have made adjustments as needed   Test Considered:  ct   Problem List / ED Course:  CP:  atypical.  Pt's cardiac work up neg.  Ddimer neg.  Pt is bradycardic, but this is chronic.  Pt is stable for d/c.  Return if worse.  F/u with pcp.   Reevaluation:  After the interventions noted above, I reevaluated the patient and found that they have :improved   Social Determinants of Health:  Lives at  home   Dispostion:  After consideration of the diagnostic results and the patients response to treatment, I feel that the patent would benefit from discharge with outpatient f/u.  Cards referral.     Final diagnoses:  Nonspecific chest pain  Bradycardia    ED Discharge Orders          Ordered    Ambulatory referral to Cardiology        03/09/24 1526               Dean Clarity, MD 03/09/24 1657

## 2024-03-28 DIAGNOSIS — E785 Hyperlipidemia, unspecified: Secondary | ICD-10-CM | POA: Insufficient documentation

## 2024-03-28 DIAGNOSIS — R931 Abnormal findings on diagnostic imaging of heart and coronary circulation: Secondary | ICD-10-CM | POA: Insufficient documentation

## 2024-03-28 NOTE — Progress Notes (Unsigned)
 Cardiology Office Note:   Date:  03/28/2024  ID:  Gary Acevedo, DOB 1961/04/22, MRN [id number] PCP: Clarice Nottingham, MD  [facility] Providers Cardiologist:  None {  History of Present Illness:   Gary Acevedo is a 63 y.o. male who presents for evaluation of bradycardia and SVT.    He was referred by Clarice Nottingham, MD.  He is complaining of dizziness.  I did review primary care records.  He may have ordered a ZIO although this did not come with his primary care records so I have not seen this result.  I did review electrolytes from late last year and these were normal.  TSH was normal.  Of note I also see a coronary calcium score was 41.  This was done in November 2022.  Echo in 2010 demonstrated normal EF and no other abnormalities.   Event monitor at that time demonstrated no arrhythmias.    ***   ***  He said that he had a monitor placed which was a 2-week device because he was dizzy.  His meds have been switched from amlodipine to olmesartan and this may have contributed.  He said the monitor showed that he had heart rates in the 40s and 50s with activity and in the 30s at rest.  However, he has never had any presyncope or syncope.  He denies any chest pressure, neck or arm discomfort.  Has not had any new palpitations, presyncope or syncope.  He has never had any prior cardiac history.  He does push-ups and some light weights though he does not do a lot of aerobic activity routinely.  He walks his dog twice a day.     ROS: ***  Studies Reviewed:    EKG:       ***  Risk Assessment/Calculations:   {Does this patient have ATRIAL FIBRILLATION?[phone]} No BP recorded.  {Refresh Note OR Click here to enter BP  :1}***        Physical Exam:   VS:  There were no vitals taken for this visit.   Wt Readings from Last 3 Encounters:  01/02/22 170 lb (77.1 kg)  07/27/21 169 lb (76.7 kg)  03/27/18 175 lb (79.4 kg)     GEN: Well nourished, well developed in no acute  distress NECK: No JVD; No carotid bruits CARDIAC: ***RR, *** murmurs, rubs, gallops RESPIRATORY:  Clear to auscultation without rales, wheezing or rhonchi  ABDOMEN: Soft, non-tender, non-distended EXTREMITIES:  No edema; No deformity   ASSESSMENT AND PLAN:   SVT: ***   I will review the monitor when the device is available.  I suspect some short nonsustained SVT and he is not really feeling this.  No change in therapy would be needed.   BRADYCARDIA:   ***   I doubt chronotropic incompetence.  He has no symptoms consistent with symptomatic bradycardia arrhythmias.  He can keep an eye on his heart rate when he is physically active and I have suggested an increase aerobic exercise regimen.  If he finds any limitations related to this he would let me know and I probably would do further testing but at this point I am not suspecting chronotropic incompetence.   DYSLIPIDEMIA: LDL was ***  in December was 73.  Total cholesterol 127, HDL 44.  No change in therapy.   ELEVATED CORONARY CALCIUM:   ***  We had a long discussion about this.  He has no symptoms.  He is pursuing aggressive risk reduction.  No change  in therapy.     Follow up ***  Signed, Lynwood Schilling, MD

## 2024-03-30 ENCOUNTER — Ambulatory Visit: Attending: Cardiology | Admitting: Cardiology

## 2024-03-30 ENCOUNTER — Encounter: Payer: Self-pay | Admitting: Cardiology

## 2024-03-30 ENCOUNTER — Ambulatory Visit: Admitting: Cardiology

## 2024-03-30 VITALS — BP 120/74 | HR 49 | Ht 70.0 in | Wt 174.0 lb

## 2024-03-30 DIAGNOSIS — I471 Supraventricular tachycardia, unspecified: Secondary | ICD-10-CM | POA: Diagnosis not present

## 2024-03-30 DIAGNOSIS — R001 Bradycardia, unspecified: Secondary | ICD-10-CM | POA: Diagnosis not present

## 2024-03-30 DIAGNOSIS — R072 Precordial pain: Secondary | ICD-10-CM

## 2024-03-30 DIAGNOSIS — R9431 Abnormal electrocardiogram [ECG] [EKG]: Secondary | ICD-10-CM

## 2024-03-30 DIAGNOSIS — E785 Hyperlipidemia, unspecified: Secondary | ICD-10-CM

## 2024-03-30 DIAGNOSIS — R931 Abnormal findings on diagnostic imaging of heart and coronary circulation: Secondary | ICD-10-CM | POA: Diagnosis not present

## 2024-03-30 NOTE — Patient Instructions (Addendum)
 Medication Instructions:  Your physician recommends that you continue on your current medications as directed. Please refer to the Current Medication list given to you today.  *If you need a refill on your cardiac medications before your next appointment, please call your pharmacy*  Lab Work: BMET, CBC-TODAY If you have labs (blood work) drawn today and your tests are completely normal, you will receive your results only by: MyChart Message (if you have MyChart) OR A paper copy in the mail If you have any lab test that is abnormal or we need to change your treatment, we will call you to review the results.  Testing/Procedures: Your physician has requested that you have an echocardiogram. Echocardiography is a painless test that uses sound waves to create images of your heart. It provides your doctor with information about the size and shape of your heart and how well your heart's chambers and valves are working. This procedure takes approximately one hour. There are no restrictions for this procedure. Please do NOT wear cologne, perfume, aftershave, or lotions (deodorant is allowed). Please arrive 15 minutes prior to your appointment time.  Please note: We ask at that you not bring children with you during ultrasound (echo/ vascular) testing. Due to room size and safety concerns, children are not allowed in the ultrasound rooms during exams. Our front office staff cannot provide observation of children in our lobby area while testing is being conducted. An adult accompanying a patient to their appointment will only be allowed in the ultrasound room at the discretion of the ultrasound technician under special circumstances. We apologize for any inconvenience.     Your cardiac CT will be scheduled at the below location:   [facility] [address]  [place], [place] [address] [phone]  If scheduled at the Heart and Vascular Tower at [facility], please  enter the parking lot using the [address] entrance and use the FREE valet service at the patient drop-off area. Enter the building and check-in with registration on the main floor.  Please follow these instructions carefully (unless otherwise directed):  An IV will be required for this test and Nitroglycerin  will be given.  Hold all erectile dysfunction medications at least 3 days (72 hrs) prior to test. (Ie viagra, cialis, sildenafil, tadalafil, etc)   On the Night Before the Test: Be sure to Drink plenty of water. Do not consume any caffeinated/decaffeinated beverages or chocolate 12 hours prior to your test. Do not take any antihistamines 12 hours prior to your test.  If the patient has contrast allergy: Patient will need a prescription for Prednisone and very clear instructions (as follows): Prednisone 50 mg - take 13 hours prior to test Take another Prednisone 50 mg 7 hours prior to test Take another Prednisone 50 mg 1 hour prior to test Take Benadryl 50 mg 1 hour prior to test Patient must complete all four doses of above prophylactic medications. Patient will need a ride after test due to Benadryl.  On the Day of the Test: Drink plenty of water until 1 hour prior to the test. Do not eat any food 1 hour prior to test. You may take your regular medications prior to the test.  If you take Furosemide/Hydrochlorothiazide/Spironolactone/Chlorthalidone, please HOLD on the morning of the test. Patients who wear a continuous glucose monitor MUST remove the device prior to scanning.     After the Test: Drink plenty of water. After receiving IV contrast, you may experience a mild  flushed feeling. This is normal. On occasion, you may experience a mild rash up to 24 hours after the test. This is not dangerous. If this occurs, you can take Benadryl 25 mg, Zyrtec, Claritin, or Allegra and increase your fluid intake. (Patients taking Tikosyn should avoid Benadryl, and may take Zyrtec,  Claritin, or Allegra) If you experience trouble breathing, this can be serious. If it is severe call 911 IMMEDIATELY. If it is mild, please call our office.  We will call to schedule your test 2-4 weeks out understanding that some insurance companies will need an authorization prior to the service being performed.   For more information and frequently asked questions, please visit our website : [email or web address]  For non-scheduling related questions, please contact the cardiac imaging nurse navigator should you have any questions/concerns: Cardiac Imaging Nurse Navigators Direct Office Dial: [phone]   For scheduling needs, including cancellations and rescheduling, please call [place], [phone].   Follow-Up: At [facility], you and your health needs are our priority.  As part of our continuing mission to provide you with exceptional heart care, our providers are all part of one team.  This team includes your primary Cardiologist (physician) and Advanced Practice Providers or APPs (Physician Assistants and Nurse Practitioners) who all work together to provide you with the care you need, when you need it.  Your next appointment:   1 year(s)  Provider:   Dr Lavona

## 2024-03-31 ENCOUNTER — Ambulatory Visit: Payer: Self-pay | Admitting: Cardiology

## 2024-03-31 DIAGNOSIS — E785 Hyperlipidemia, unspecified: Secondary | ICD-10-CM

## 2024-03-31 LAB — BASIC METABOLIC PANEL WITH GFR
BUN/Creatinine Ratio: 17 (ref 10–24)
BUN: 15 mg/dL (ref 8–27)
CO2: 24 mmol/L (ref 20–29)
Calcium: 9.2 mg/dL (ref 8.6–10.2)
Chloride: 103 mmol/L (ref 96–106)
Creatinine, Ser: 0.86 mg/dL (ref 0.76–1.27)
Glucose: 80 mg/dL (ref 70–99)
Potassium: 4.4 mmol/L (ref 3.5–5.2)
Sodium: 143 mmol/L (ref 134–144)
eGFR: 98 mL/min/1.73 (ref >59)

## 2024-03-31 LAB — CBC
Hematocrit: 48.8 % (ref 37.5–51.0)
Hemoglobin: 16.4 g/dL (ref 13.0–17.7)
MCH: 33.3 pg — ABNORMAL HIGH (ref 26.6–33.0)
MCHC: 33.6 g/dL (ref 31.5–35.7)
MCV: 99 fL — ABNORMAL HIGH (ref 79–97)
Platelets: 184 x10E3/uL (ref 150–450)
RBC: 4.93 x10E6/uL (ref 4.14–5.80)
RDW: 12.4 % (ref 11.6–15.4)
WBC: 3.8 x10E3/uL (ref 3.4–10.8)

## 2024-04-03 ENCOUNTER — Encounter (HOSPITAL_COMMUNITY): Payer: Self-pay

## 2024-04-05 ENCOUNTER — Ambulatory Visit (HOSPITAL_COMMUNITY)
Admission: RE | Admit: 2024-04-05 | Discharge: 2024-04-05 | Disposition: A | Discharge status: Home / Self Care | Source: Ambulatory Visit | Attending: Cardiology | Admitting: Cardiology

## 2024-04-05 DIAGNOSIS — I700 Atherosclerosis of aorta: Secondary | ICD-10-CM | POA: Diagnosis not present

## 2024-04-05 DIAGNOSIS — I2510 Atherosclerotic heart disease of native coronary artery without angina pectoris: Secondary | ICD-10-CM | POA: Insufficient documentation

## 2024-04-05 DIAGNOSIS — R072 Precordial pain: Secondary | ICD-10-CM | POA: Diagnosis present

## 2024-04-05 MED ORDER — IOHEXOL 350 MG/ML SOLN
100.0000 mL | Freq: Once | INTRAVENOUS | Status: AC | PRN
  Administered 2024-04-05: 100 mL via INTRAVENOUS

## 2024-04-05 MED ORDER — NITROGLYCERIN 0.4 MG SL SUBL
0.8000 mg | SUBLINGUAL_TABLET | Freq: Once | SUBLINGUAL | Status: AC
  Administered 2024-04-05: 0.8 mg via SUBLINGUAL

## 2024-04-06 ENCOUNTER — Ambulatory Visit (HOSPITAL_BASED_OUTPATIENT_CLINIC_OR_DEPARTMENT_OTHER)
Admission: RE | Admit: 2024-04-06 | Discharge: 2024-04-06 | Disposition: A | Discharge status: Home / Self Care | Source: Ambulatory Visit | Attending: Cardiovascular Disease | Admitting: Cardiovascular Disease

## 2024-04-06 ENCOUNTER — Other Ambulatory Visit: Payer: Self-pay | Admitting: Cardiovascular Disease

## 2024-04-06 DIAGNOSIS — I2510 Atherosclerotic heart disease of native coronary artery without angina pectoris: Secondary | ICD-10-CM

## 2024-04-06 DIAGNOSIS — R931 Abnormal findings on diagnostic imaging of heart and coronary circulation: Secondary | ICD-10-CM | POA: Diagnosis not present

## 2024-04-16 NOTE — Telephone Encounter (Signed)
 Spoke with pt regarding Zetia. The pt stated he has taken this medication before and it caused his liver enzymes to elevate. Pt wanted Dr. Lavona to know this before starting the medication again. Pt was told Dr. Lavona would be notified. Pt verbalized understanding. All questions if any were answered.

## 2024-04-16 NOTE — Telephone Encounter (Signed)
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  He needs continued medical management.   I think that his LDL should be in the 50s given this.  I  would increase to 40 mg of Lipitor if he is willing to try that because the dose of statin should be at least moderate and then repeat a lipid profile in 3 months.  I would be happy to see him back  in Nov/Dec to discuss.  Call Mr. Dasie with the results and send results to Clarice Nottingham, MD  ----- Message ----- From: Interface, Rad Results In Sent: 04/06/2024   2:40 PM EDT To: Lynwood Schilling, MD

## 2024-04-20 NOTE — Telephone Encounter (Signed)
-----   Message from Lynwood Schilling sent at 04/13/2024  5:59 PM EDT ----- Sorry I had the med wrong. I would add Zetia 10 mg and repeat a lipid in 3 months.   Call Mr. Dasie with the results and send results to Clarice Nottingham, MD  ----- Message ----- From: Interface, Rad Results In Sent: 04/06/2024   2:40 PM EDT To: Lynwood Schilling, MD

## 2024-04-20 NOTE — Telephone Encounter (Signed)
 Spoke with pt regarding a referral to pharm D. Pt agreeable but wanted Dr. Lavona to know that he thinks he was taking a statin with the Zetia the last time he took it so it may have been the statin that was causing the elevation in liver enzymes. Pt was told Dr. Lavona would be notified. A referral to pharm D was place. Pt verbalized understanding. All questions if any were answered.

## 2024-04-27 ENCOUNTER — Ambulatory Visit (HOSPITAL_COMMUNITY)
Admission: RE | Admit: 2024-04-27 | Discharge: 2024-04-27 | Disposition: A | Discharge status: Home / Self Care | Source: Ambulatory Visit | Attending: Cardiology | Admitting: Cardiology

## 2024-04-27 DIAGNOSIS — I119 Hypertensive heart disease without heart failure: Secondary | ICD-10-CM | POA: Insufficient documentation

## 2024-04-27 DIAGNOSIS — R9431 Abnormal electrocardiogram [ECG] [EKG]: Secondary | ICD-10-CM | POA: Diagnosis not present

## 2024-04-27 DIAGNOSIS — E785 Hyperlipidemia, unspecified: Secondary | ICD-10-CM | POA: Insufficient documentation

## 2024-04-27 DIAGNOSIS — R072 Precordial pain: Secondary | ICD-10-CM

## 2024-04-30 LAB — ECHOCARDIOGRAM COMPLETE
Area-P 1/2: 3.6 cm2
S' Lateral: 2.84 cm

## 2024-05-28 ENCOUNTER — Ambulatory Visit: Admitting: Pulmonary Disease

## 2024-05-28 ENCOUNTER — Encounter: Payer: Self-pay | Admitting: Pulmonary Disease

## 2024-05-28 VITALS — BP 147/80 | HR 51 | Ht 70.0 in | Wt 176.0 lb

## 2024-05-28 DIAGNOSIS — R918 Other nonspecific abnormal finding of lung field: Secondary | ICD-10-CM

## 2024-05-28 NOTE — Progress Notes (Signed)
 New Patient Pulmonology Office Visit   Subjective:  Patient ID: Gary Acevedo, male    DOB: [birth year]  MRN: [id number]  Referred by: Clarice Nottingham, MD  CC:  Chief Complaint  Patient presents with   Consult    Pt states  2 small nods    Discussed the use of AI scribe software for clinical note transcription with the patient, who gave verbal consent to proceed.  History of Present Illness Gary Acevedo is a 63 year old male with pulmonary nodules who presents for evaluation.   In October 2025, a CT coronary scan obtained for chest pain evaluation showed a 6 mm right middle lobe pulmonary nodule and a 4 mm right lower lobe pleural-based nodule. The ER workup for left-sided chest pain in September 2025 was unremarkable, and the nodules were found incidentally on subsequent cardiac imaging.  He has no shortness of breath, cough, or wheezing. He has never smoked or vaped and has no dust or chemical occupational exposures. He had secondhand smoke exposure in childhood from his mother. There is no family history of lung disease or lung cancer.  He had bladder cancer in 2020 treated with BCG and has been cancer-free since. He had COVID-19 in September 2024.  A CT abdomen in 2020 showed the right middle lobe nodule already present. The right lower lobe pleural-based nodule is new compared with the 2020 imaging.        Review of Systems  Constitutional:  Negative for chills, fever, malaise/fatigue and weight loss.  HENT:  Negative for congestion, sinus pain and sore throat.   Eyes: Negative.   Respiratory:  Negative for cough, hemoptysis, sputum production, shortness of breath and wheezing.   Cardiovascular:  Negative for chest pain, palpitations, orthopnea, claudication and leg swelling.  Gastrointestinal:  Negative for abdominal pain, heartburn, nausea and vomiting.  Genitourinary: Negative.   Musculoskeletal:  Negative for joint pain and myalgias.  Skin:  Negative for  rash.  Neurological:  Negative for weakness.  Endo/Heme/Allergies: Negative.   Psychiatric/Behavioral: Negative.      Allergies: Amoxicillin, Sulfa antibiotics, and Sulfa drugs cross reactors  Current Outpatient Medications:    amLODipine (NORVASC) 2.5 MG tablet, 1 tablet, Disp: , Rfl:    citalopram (CELEXA) 20 MG tablet, , Disp: , Rfl:    dolutegravir-lamiVUDine (DOVATO) 50-300 MG tablet, 1 tablet, Disp: , Rfl:    Omega-3 1000 MG CAPS, Take by mouth., Disp: , Rfl:    Pitavastatin Calcium 4 MG TABS, Take 1 tablet by mouth daily., Disp: , Rfl:    Multiple Vitamin (MULTI-VITAMIN) tablet, Take 1 tablet by mouth daily., Disp: , Rfl:  Past Medical History:  Diagnosis Date   Anxiety    Dizziness    Dyslipidemia    HIV (human immunodeficiency virus infection) (HCC)    Hypercholesteremia    Hypertension    Near syncope    OSA (obstructive sleep apnea)    Palpitations    Past Surgical History:  Procedure Laterality Date   TONSILLECTOMY  2004   UVULOPALATOPHARYNGOPLASTY  2004   Family History  Problem Relation Age of Onset   COPD Mother    Hypertension Mother    Alzheimer's disease Mother    Hypertension Father    Hyperlipidemia Father    Social History   Socioeconomic History   Marital status: Single    Spouse name: Not on file   Number of children: Not on file   Years of education: Not on file  Highest education level: Not on file  Occupational History   Not on file  Tobacco Use   Smoking status: Never   Smokeless tobacco: Never  Vaping Use   Vaping status: Never Used  Substance and Sexual Activity   Alcohol use: Yes    Comment: occasional   Drug use: No   Sexual activity: Not on file  Other Topics Concern   Not on file  Social History Narrative   Lives alone.  BoF.     Social Drivers of Corporate Investment Banker Strain: Not on file  Food Insecurity: No Food Insecurity (09/08/2023)   Received from [facility]   Hunger Vital Sign     Within the past 12 months, you worried that your food would run out before you got the money to buy more.: Never true    Within the past 12 months, the food you bought just didn't last and you didn't have money to get more.: Never true  Transportation Needs: Not on file  Physical Activity: Not on file  Stress: Not on file  Social Connections: Unknown (11/03/2021)   Received from [facility]   Social Network    Social Network: Not on file  Intimate Partner Violence: Unknown (09/25/2021)   Received from [facility]   HITS    Physically Hurt: Not on file    Insult or Talk Down To: Not on file    Threaten Physical Harm: Not on file    Scream or Curse: Not on file       Objective:  BP (!) 147/80   Pulse (!) 51   Ht 5' 10 (1.778 m) Comment: per pt  Wt 176 lb (79.8 kg)   SpO2 100%   BMI 25.25 kg/m    Physical Exam Constitutional:      General: He is not in acute distress.    Appearance: Normal appearance.  Eyes:     General: No scleral icterus.    Conjunctiva/sclera: Conjunctivae normal.  Cardiovascular:     Rate and Rhythm: Normal rate and regular rhythm.  Pulmonary:     Breath sounds: No wheezing, rhonchi or rales.  Musculoskeletal:     Right lower leg: No edema.     Left lower leg: No edema.  Skin:    General: Skin is warm and dry.  Neurological:     General: No focal deficit present.     Diagnostic Review:    CT Coronary Scan 04/05/24 6 mm right middle lobe pulmonary nodule with a 4 mm right lower lobe pleural based pulmonary nodule versus focal scar. Non-contrast chest CT at 3-6 months is recommended. If the nodules are stable at time of repeat CT, then future CT at 18-24 months (from today's scan) is considered optional for low-risk patients, but is recommended for high-risk patients.      Assessment & Plan:   Assessment & Plan Lung nodules  Orders:   CT Chest Wo Contrast; Future   Assessment and Plan Assessment & Plan Pulmonary nodules, right  middle and right lower lobe Incidental 6 mm right middle lobe and 4 mm right lower lobe nodules on CT. Low risk for malignancy. Differential includes benign etiologies. - Follow-up CT chest scan in April 2026 to monitor nodules. - Schedule follow-up appointment post-scan to review results. - Consider PET CT or biopsy if nodules grow. - Plan annual CT scans if nodules remain stable      Return in about 19 weeks (around 10/08/2024) for f/u  visit Dr. Kara.   Dorn KATHEE Kara, MD

## 2024-05-28 NOTE — Patient Instructions (Signed)
 We will check a CT Chest scan in April 2026  Follow up after the CT Scan in April to review the new study

## 2024-06-24 NOTE — Progress Notes (Unsigned)
 Patient ID: Gary Acevedo                 DOB: Oct 29, 1960                    MRN: [id number]      HPI: Gary Acevedo is a 64 y.o. male patient referred to lipid clinic by Dr.Hochrein. PMH is significant for HTN, HLD, OSA, HIV, Anxiety   Patient has been on pitavastatin 4 mg daily. He had tried Zetia 10 mg in the past which had lead to liver enzyme increment. Given  non obstructive CAD with moderate stenosis on the LAD his LDLc goal is <70 mg/dl patient was referred to Pharm D lipid clinic.  Patient presented today. Reports his PCP had initiated Zetia mid November. He has been taking it with pitavastatin 4 mg daily. His Recent Lab suggest LDL level of 60 mg/dl ( 87/69/7974). We discussed family history in detail - no family hx of stroke or MI. In the past he has tried various statin - doesn't recall all the names only remember rosuvastatin and atorvastatin which had lead to LFT elevation so they were stopped and pitavastatin was initiated. He follows fairly heathy diet. Due to busy work schedule doesn't get time to do exercise.  Reviewed options for lowering LDL cholesterol, including ezetimibe, PCSK-9 inhibitors, bempedoic acid and inclisiran.  Discussed mechanisms of action, dosing, side effects and potential decreases in LDL cholesterol.  Also reviewed cost information and potential options for patient assistance.  Current Medications: pitavastain 4 mg daily and Zetia 10 mg daily  Intolerances: Atorvastatin and rosuvastatin -  LFT elevation  Risk Factors: hypertension, CAD  LDL goal: <70 mg/dl   Diet: breakfast and lunch fairly healthy, for dinner rely on outside food but makes healthy choices  Drinks: water  Snacks - popcorn, fruits    Exercise: walks  0.5 to 1 mile per day  Gym - twice  a month weights  Push ups 2-3 times per   Family History:  Relation Problem Comments  Mother (Deceased) Alzheimer's disease   COPD   Hypertension     Father (Deceased) Hyperlipidemia    Hypertension      Social History:  Alcohol: 1-2 drinks per week  Smoking: never. Was exposed to passive smoking as a child as mother was smoker  Labs:  Lipid Panel  No results found for: CHOL, TRIG, HDL, CHOLHDL, VLDL, LDLCALC, LDLDIRECT, LABVLDL  Past Medical History:  Diagnosis Date   Anxiety    Dizziness    Dyslipidemia    HIV (human immunodeficiency virus infection) (HCC)    Hypercholesteremia    Hypertension    Near syncope    OSA (obstructive sleep apnea)    Palpitations     Medications Ordered Prior to Encounter[1]  Allergies[2]  Assessment/Plan:  1. Hyperlipidemia -  Problem  Hypercholesteremia   Current Medications: pitavastain 4 mg daily and Zetia 10 mg daily  Intolerances: Atorvastatin and rosuvastatin -  LFT elevation  Risk Factors: hypertension, CAD  LDL goal: <70 mg/dl     Hypercholesteremia Assessment:  LDL goal: <70 mg/dl or stricter goal can be <55 mg/dl  last LDLc 60mg /dl 87/69/7974  Zetia were added to pitavastatin mid November tolerates them well  Intolerance to rosuvastatin and atorvastatin - LFT elevation   Discussed next potential options (PCSK-9 inhibitors, bempedoic acid and inclisiran); cost, dosing efficacy, side effects  Follows fairly healthy diet   Plan: Continue taking current medications (pitavastatin 4 mg daily  and Zetia 10 mg daily  ) Will recheck Lipid and LFT end of March( 09/11/2024)  if not tolerated or LDLc not at goal will consider injectable therapy     Thank you,  Robbi Blanch, Pharm.D [place] Gary Acevedo. [facility] [address], [place], [place] [address] Phone: [phone]; Fax: [phone]       [1]  Current Outpatient Medications on File Prior to Visit  Medication Sig Dispense Refill   amLODipine (NORVASC) 2.5 MG tablet 1 tablet     citalopram (CELEXA) 20 MG tablet      dolutegravir-lamiVUDine (DOVATO) 50-300 MG tablet 1 tablet     Omega-3  1000 MG CAPS Take by mouth.     Pitavastatin Calcium 4 MG TABS Take 1 tablet by mouth daily.     No current facility-administered medications on file prior to visit.  [2]  Allergies Allergen Reactions   Amoxicillin Rash   Sulfa Antibiotics Itching and Rash   Sulfa Drugs Cross Reactors Itching and Rash

## 2024-06-25 ENCOUNTER — Encounter: Payer: Self-pay | Admitting: Pharmacist

## 2024-06-25 ENCOUNTER — Ambulatory Visit: Admitting: Pharmacist

## 2024-06-25 DIAGNOSIS — E7800 Pure hypercholesterolemia, unspecified: Secondary | ICD-10-CM | POA: Diagnosis not present

## 2024-06-25 NOTE — Assessment & Plan Note (Addendum)
 Assessment:  LDL goal: <70 mg/dl or stricter goal can be <55 mg/dl  last LDLc 60mg /dl 87/69/7974  Zetia were added to pitavastatin mid November tolerates them well  Intolerance to rosuvastatin and atorvastatin - LFT elevation   Discussed next potential options (PCSK-9 inhibitors, bempedoic acid and inclisiran); cost, dosing efficacy, side effects  Follows fairly healthy diet   Plan: Continue taking current medications (pitavastatin 4 mg daily and Zetia 10 mg daily  ) Will recheck Lipid and LFT end of March( 09/11/2024)  if not tolerated or LDLc not at goal will consider injectable therapy

## 2024-10-10 ENCOUNTER — Other Ambulatory Visit
